# Patient Record
Sex: Female | Born: 1940 | Race: White | Hispanic: No | Marital: Married | Smoking: Never smoker
Health system: Southern US, Community
[De-identification: ages and names within clinical notes are randomized; demographics above are authoritative.]

## PROBLEM LIST (undated history)

## (undated) DIAGNOSIS — R404 Transient alteration of awareness: Secondary | ICD-10-CM

## (undated) DIAGNOSIS — I8291 Chronic embolism and thrombosis of unspecified vein: Secondary | ICD-10-CM

## (undated) DIAGNOSIS — M8580 Other specified disorders of bone density and structure, unspecified site: Secondary | ICD-10-CM

## (undated) DIAGNOSIS — E785 Hyperlipidemia, unspecified: Secondary | ICD-10-CM

## (undated) DIAGNOSIS — M40209 Unspecified kyphosis, site unspecified: Secondary | ICD-10-CM

## (undated) HISTORY — DX: Transient alteration of awareness: R40.4

## (undated) HISTORY — PX: OTHER SURGICAL HISTORY: SHX169

## (undated) HISTORY — DX: Hyperlipidemia, unspecified: E78.5

## (undated) HISTORY — PX: BLADDER SURGERY: SHX569

## (undated) HISTORY — DX: Unspecified kyphosis, site unspecified: M40.209

## (undated) HISTORY — DX: Chronic embolism and thrombosis of unspecified vein: I82.91

## (undated) HISTORY — DX: Other specified disorders of bone density and structure, unspecified site: M85.80

---

## 2015-07-02 DIAGNOSIS — E782 Mixed hyperlipidemia: Secondary | ICD-10-CM | POA: Diagnosis not present

## 2015-07-02 DIAGNOSIS — M40204 Unspecified kyphosis, thoracic region: Secondary | ICD-10-CM | POA: Diagnosis not present

## 2015-07-02 DIAGNOSIS — M8580 Other specified disorders of bone density and structure, unspecified site: Secondary | ICD-10-CM | POA: Diagnosis not present

## 2015-07-06 DIAGNOSIS — E042 Nontoxic multinodular goiter: Secondary | ICD-10-CM | POA: Diagnosis not present

## 2015-07-06 DIAGNOSIS — R221 Localized swelling, mass and lump, neck: Secondary | ICD-10-CM | POA: Diagnosis not present

## 2015-07-08 DIAGNOSIS — Z1231 Encounter for screening mammogram for malignant neoplasm of breast: Secondary | ICD-10-CM | POA: Diagnosis not present

## 2015-08-18 ENCOUNTER — Encounter: Payer: Self-pay | Admitting: Neurology

## 2015-08-18 ENCOUNTER — Ambulatory Visit: Payer: Medicare PPO | Admitting: Neurology

## 2015-08-18 ENCOUNTER — Ambulatory Visit (INDEPENDENT_AMBULATORY_CARE_PROVIDER_SITE_OTHER): Payer: Medicare PPO | Admitting: Neurology

## 2015-08-18 VITALS — BP 154/74 | HR 70 | Ht <= 58 in | Wt 147.2 lb

## 2015-08-18 DIAGNOSIS — E785 Hyperlipidemia, unspecified: Secondary | ICD-10-CM | POA: Insufficient documentation

## 2015-08-18 DIAGNOSIS — R51 Headache: Secondary | ICD-10-CM

## 2015-08-18 DIAGNOSIS — R519 Headache, unspecified: Secondary | ICD-10-CM

## 2015-08-18 DIAGNOSIS — R220 Localized swelling, mass and lump, head: Secondary | ICD-10-CM

## 2015-08-18 DIAGNOSIS — I676 Nonpyogenic thrombosis of intracranial venous system: Secondary | ICD-10-CM

## 2015-08-18 DIAGNOSIS — G08 Intracranial and intraspinal phlebitis and thrombophlebitis: Secondary | ICD-10-CM

## 2015-08-18 NOTE — Progress Notes (Signed)
[facility]    Provider:  Dr Lucia GaskinsAhern Referring Provider: Lovey Roberts, Selena S, PA Primary Care Physician:  No primary care provider on file.  CC:  Chronic venous thrombosis  HPI:  Selena Roberts is a 74 y.o. female here as a referral from Dr. Leavy CellaBoyd for chronic venous thrombosis. She was having right facial swelling. Started 6 months ago, she thought it was sinuses. She was having lots of drainage in the sinuses. She was having pain in the face, has resolved. She was having bone pain on the right cheek. They sent patient for imaging. 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 No headaches. No altered consciousness, no seizures, no focal weakness, no vision changes or blurry vision or double vision. No hx of cancer. 25 years ago fell on her head on the right side on a brick. [facility] stumbling on a curve 10 years ago. No recent falls. No recent trauma to the head. Never had blood clot in lungs or legs. She has had 2 miscariiages. No hearing changes or pulsatile tinnitus. She recently started taking aspirin daily.  Reviewed notes, labs and imaging from outside physicians, which showed:  [facility] describe a patient with back pain, chronic and stable. Hyperlipidemia. Nasal congestion, nasal drainage, facial pain and facial swelling and sinus congestion. Exam was significant was some minimal facial swelling along the mandible with intermittent discomfort which is why a CT of the neck with contrast was ordered.  CMP from 07/02/2015 is unremarkable with a creatinine of 0.85 and an alkaline phosphatase of 122 otherwise normal.  CT of the neck with contrast media showed mild atherosclerosis involving the carotid bulbs bilaterally without evidence of stenosis. Thrombosis of the left transverse sinus and sigmoid sinus intracranially with thrombus extending into the jugular bulb. The internal jugular vein is why the patent below the skull base.  The sigmoid and transverse sinuses are partially recanalized, and small collateral veins are present in the left posterior fossa. No focal parenchymal abnormalities on limited intracranial views involving the visualized brain. No evidence of mass involving the right side of the face or neck. No pathologic lymphadenopathy. No acute abnormalities. 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 The internal jugular vein is patent below the skull base.  Review of Systems: Patient complains of symptoms per HPI as well as the following symptoms: No chest pain, no shortness of breath, no fevers, no chills. Pertinent negatives per HPI. All others negative.   Social History   Social History  . Marital Status: Married    Spouse Name: Selena FearingJames   . Number of Children: 6  . Years of Education: N/A   Occupational History  . Retired    Social History Main Topics  . Smoking status: Never Smoker   . Smokeless tobacco: Not on file  . Alcohol Use: No  . Drug Use: No  . Sexual Activity: Not on file   Other Topics Concern  . Not on file   Social History Narrative   Lives at home with husband   Caffeine use: 5 drinks tea per week    Family History  Problem Relation Age of Onset  . Stroke Neg Hx   . Migraines Neg Hx     Past Medical History  Diagnosis Date  . Hyperlipidemia   . Osteopenia   . Kyphosis     Past Surgical History  Procedure Laterality Date  . No surgical history      Current Outpatient Prescriptions  Medication Sig Dispense Refill  . Cholecalciferol (VITAMIN D3) 2000 UNITS TABS Take 1 tablet by mouth daily.    . metFORMIN (GLUCOPHAGE-XR) 500 MG 24 hr tablet Take 500 mg by mouth daily.  2   No current facility-administered medications for this visit.    Allergies as of 08/18/2015  . (Not on File)    Vitals: BP 154/74 mmHg  Pulse 70  Ht   (1.473 m)  Wt 147 lb 3.2 oz (66.769 kg)  BMI 30.77 kg/m2 Last Weight:  Wt Readings from Last 1 Encounters:  08/18/15 147 lb 3.2 oz (66.769 kg)   Last Height:   Ht Readings from Last 1 Encounters:  08/18/15  (1.473 m)   Physical exam: Exam: Gen: NAD, conversant, well nourised, obese, well groomed                     CV: RRR, no MRG. No Carotid Bruits. No peripheral edema, warm, nontender Eyes: Conjunctivae clear without exudates or hemorrhage  Neuro: Detailed Neurologic Exam  Speech:    Speech is normal; fluent and spontaneous with normal comprehension.  Cognition:    The patient is oriented to person, place, and time;     recent and remote memory intact;     language fluent;     normal attention, concentration,     fund of knowledge Cranial Nerves:    The pupils are equal, round, and reactive to light. Attempted visualization of fundi but could not due to small pupils. Visual fields are full to finger confrontation. Extraocular movements are intact. Trigeminal sensation is intact and the muscles of mastication are normal. The face is symmetric. The palate elevates in the midline. Hearing intact. Voice is normal. Shoulder shrug is normal. The tongue has normal motion without fasciculations.   Coordination:    No dysmetria.   Gait:    Not ataxic  Motor Observation:    No asymmetry, no atrophy, and no involuntary movements noted. Tone:    Normal muscle tone.    Posture:    Posture is normal. normal erect    Strength:    Strength is V/V in the upper and lower limbs.      Sensation: intact to LT     Reflex Exam:  DTR's:    Deep tendon reflexes in the upper and lower extremities are normal bilaterally.   Toes:    The toes are downgoing bilaterally.   Clonus:    Clonus is absent.       Assessment/Plan:  74 year old female with right-sided facial pain. 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 Patient denies any history of malignancy or other disorders that would predispose her to thrombosis. However no underlying etiology or risk factor is not found in a percentage of adult patients with venous thrombosis. Will order serum workup for some prothrombotic conditions which increase risk for developing a CVT. Other precipitants for cerebral venous thrombosis include head trauma and patient has had several falls in the past. Will need more sensitive imaging and we will order an MRI of the brain and MRV of the head. Patient can follow up after imaging and labs completed. Continue daily aspirin.  CC: Claudie Fisherman, MD  [facility] [phone]  [address] [place], [place] [address]  Phone [phone] Fax [phone]

## 2015-08-18 NOTE — Patient Instructions (Signed)
Remember to drink plenty of fluid, eat healthy meals and do not skip any meals. Try to eat protein with a every meal and eat a healthy snack such as fruit or nuts in between meals. Try to keep a regular sleep-wake schedule and try to exercise daily, particularly in the form of walking, 20-30 minutes a day, if you can.   As far as your medications are concerned, I would like to suggest: daily aspirin  As far as diagnostic testing: MRI/MRV of the brain, labs  I would like to see you back in 3 months, sooner if we need to. Please call us with any interim questions, concerns, problems, updates or refill requests.   Please also call us for any test results so we can go over those with you on the phone.  My clinical assistant and will answer any of your questions and relay your messages to me and also relay most of my messages to you.   Our phone number is [phone]. We also have an after hours call service for urgent matters and there is a physician on-call for urgent questions. For any emergencies you know to call 911 or go to the nearest emergency room

## 2015-08-24 ENCOUNTER — Telehealth: Payer: Self-pay | Admitting: *Deleted

## 2015-08-24 LAB — FACTOR V LEIDEN

## 2015-08-24 LAB — COMPREHENSIVE METABOLIC PANEL
ALT: 14 IU/L (ref 0–32)
AST: 16 IU/L (ref 0–40)
Albumin/Globulin Ratio: 1.4 (ref 1.1–2.5)
Albumin: 4.1 g/dL (ref 3.5–4.8)
Alkaline Phosphatase: 115 IU/L (ref 39–117)
BUN/Creatinine Ratio: 14 (ref 11–26)
BUN: 10 mg/dL (ref 8–27)
Bilirubin Total: 0.4 mg/dL (ref 0.0–1.2)
CO2: 26 mmol/L (ref 18–29)
Calcium: 9.7 mg/dL (ref 8.7–10.3)
Chloride: 100 mmol/L (ref 97–106)
Creatinine, Ser: 0.73 mg/dL (ref 0.57–1.00)
GFR calc Af Amer: 93 mL/min/{1.73_m2} (ref >59)
GFR calc non Af Amer: 81 mL/min/{1.73_m2} (ref >59)
Globulin, Total: 3 g/dL (ref 1.5–4.5)
Glucose: 115 mg/dL — ABNORMAL HIGH (ref 65–99)
Potassium: 4.9 mmol/L (ref 3.5–5.2)
Sodium: 141 mmol/L (ref 136–144)
Total Protein: 7.1 g/dL (ref 6.0–8.5)

## 2015-08-24 LAB — HOMOCYSTEINE: Homocysteine: 9.2 umol/L (ref 0.0–15.0)

## 2015-08-24 LAB — PROTHROMBIN GENE MUTATION

## 2015-08-24 LAB — SEDIMENTATION RATE: Sed Rate: 4 mm/hr (ref 0–40)

## 2015-08-24 LAB — PROTEIN C ACTIVITY: Protein C Activity: 129 % (ref 73–180)

## 2015-08-24 LAB — ANTITHROMBIN PANEL
AT III AG PPP IMM-ACNC: 87 % (ref 72–124)
AntiThromb III Func: 104 % (ref 75–135)

## 2015-08-24 LAB — C-REACTIVE PROTEIN: CRP: 3.2 mg/L (ref 0.0–4.9)

## 2015-08-24 LAB — PROTEIN S ACTIVITY: Protein S Activity: 113 % (ref 63–140)

## 2015-08-24 NOTE — Telephone Encounter (Signed)
-----   Message from Anson FretAntonia B Ahern, MD sent at 08/24/2015  8:00 AM EDT ----- Please let patient know her labs were all normal thanks

## 2015-08-24 NOTE — Telephone Encounter (Signed)
Called pt and spoke w/ her about normal lab work. She verbalized understanding.

## 2015-09-07 ENCOUNTER — Ambulatory Visit (HOSPITAL_COMMUNITY)
Admission: RE | Admit: 2015-09-07 | Discharge: 2015-09-07 | Disposition: A | Discharge status: Home / Self Care | Payer: Medicare PPO | Source: Ambulatory Visit | Attending: Neurology | Admitting: Neurology

## 2015-09-07 DIAGNOSIS — R51 Headache: Secondary | ICD-10-CM | POA: Diagnosis not present

## 2015-09-07 DIAGNOSIS — I676 Nonpyogenic thrombosis of intracranial venous system: Secondary | ICD-10-CM | POA: Diagnosis not present

## 2015-09-07 DIAGNOSIS — R519 Headache, unspecified: Secondary | ICD-10-CM

## 2015-09-07 DIAGNOSIS — G08 Intracranial and intraspinal phlebitis and thrombophlebitis: Secondary | ICD-10-CM

## 2015-09-07 DIAGNOSIS — R220 Localized swelling, mass and lump, head: Secondary | ICD-10-CM

## 2015-09-07 DIAGNOSIS — R938 Abnormal findings on diagnostic imaging of other specified body structures: Secondary | ICD-10-CM | POA: Diagnosis not present

## 2015-09-09 ENCOUNTER — Telehealth: Payer: Self-pay | Admitting: Neurology

## 2015-09-09 NOTE — Telephone Encounter (Signed)
Dr. Roda ShuttersXu - I am out of the office and you ae the work-in doctor. Would you please call this patient and discuss results of MRi/MRV please? Thank you  MRV HEAD:  Preserved flow signal in the superior sagittal sinus, torcula, straight sinus, vein of Galen, and internal cerebral veins. Preserved flow signal in the right transverse sinus, sigmoid sinus, and right IJ bulb.  Near complete absence of flow signal along the left transverse sinus. Better preserved flow signal at the left sigmoid sinus and IJ bulb.  IMPRESSION: 1. 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 The appearance today appears stable to that on neck CT 07/06/2015. At that time the left internal jugular vein in the neck and the central venous system remained patent. Vascular Ultrasound of the neck versus repeat neck CT with IV contrast could further evaluate the neck as necessary.  2. No acute intracranial abnormality. Largely unremarkable noncontrast MRI appearance of brain parenchyma, with mild nonspecific cerebral white matter signal changes.  MRI HEAD:  Continued abnormal appearance of the left transverse and sigmoid sinuses to the level of the left IJ bulb (series 11). Subtotal filling defect within these dural sinuses was present in September. At that time the left IJ below the bulb remained patent to the chest. Other Major intracranial vascular flow voids are within normal limits.  No restricted diffusion to suggest acute infarction. No midline shift, mass effect, evidence of mass lesion, ventriculomegaly, extra-axial collection or acute intracranial hemorrhage. Cervicomedullary junction and pituitary are within normal limits.  Asymmetric volume loss along the left sylvian fissure, but without associated encephalomalacia. Mild for age supratentorial periventricular white matter nonspecific T2 and FLAIR hyperintensity. No chronic cerebral blood  products. Deep gray matter nuclei, brainstem, and cerebellum are within normal limits for age. Negative visualized cervical spine.  Visible internal auditory structures appear normal. Mastoids are clear. Negative paranasal sinuses. Negative orbit and scalp soft tissues. Visualized bone marrow signal is within normal limits

## 2015-09-09 NOTE — Telephone Encounter (Signed)
Discussed with pt over the phone. Relayed MRI and MRV result. Let her know that the left transverse sinus and sigmoid sinus as well as left IJ are similar with her CTA neck in 06/2012. It seems either chronic or developmental since there are collaterals building up already. She has no symptom and we recommend to continue ASA daily. She expressed understanding and appreciation. She has appointment with Dr. Lucia GaskinsAhern in 10/2015.   Marvel PlanJindong Merry Pond, MD PhD Stroke Neurology 09/09/2015 5:38 PM  Hi, Sheralyn Boatmanoni:  Actually CTA/CTV is much better to look for sinus thrombosis. In the future, if this pt needs more imaging for the venus thrombosis, we can order CTA or CTV head and neck at that time. Thanks.  Saraphina Lauderbaugh

## 2015-11-22 ENCOUNTER — Ambulatory Visit (INDEPENDENT_AMBULATORY_CARE_PROVIDER_SITE_OTHER): Payer: Medicare PPO | Admitting: Neurology

## 2015-11-22 ENCOUNTER — Encounter: Payer: Self-pay | Admitting: Neurology

## 2015-11-22 VITALS — BP 162/69 | HR 78 | Ht <= 58 in | Wt 144.8 lb

## 2015-11-22 DIAGNOSIS — G08 Intracranial and intraspinal phlebitis and thrombophlebitis: Secondary | ICD-10-CM | POA: Diagnosis not present

## 2015-11-22 NOTE — Patient Instructions (Signed)
Remember to drink plenty of fluid, eat healthy meals and do not skip any meals. Try to eat protein with a every meal and eat a healthy snack such as fruit or nuts in between meals. Try to keep a regular sleep-wake schedule and try to exercise daily, particularly in the form of walking, 20-30 minutes a day, if you can.   As far as your medications are concerned, I would like to suggest: continue Aspirin  I would like to see you back a needed, sooner if we need to. Please call us with any interim questions, concerns, problems, updates or refill requests.   Our phone number is [phone]. We also have an after hours call service for urgent matters and there is a physician on-call for urgent questions. For any emergencies you know to call 911 or go to the nearest emergency room

## 2015-11-22 NOTE — Progress Notes (Signed)
[facility]    Provider:  Dr Lucia Gaskins Referring Provider: No ref. provider found Primary Care Physician:  No primary care provider on file.  CC: Chronic venous thrombosis  Interval update 11/22/2015:Patient and husband return to discuss imaging. Shows chronic left venous thrombosis. There appear to be collaterals already formed so she may have had this for an unknow amout of time, possibly years. Reviewed images and showed patient and husband. Answered question. No intervention required. Continue with daily aspirin.   IMPRESSION: 1. 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 The appearance today appears stable to that on neck CT 07/06/2015. At that time the left internal jugular vein in the neck and the central venous system remained patent. Vascular Ultrasound of the neck versus repeat neck CT with IV contrast could further evaluate the neck as necessary. 2. No acute intracranial abnormality. Largely unremarkable noncontrast MRI appearance of brain parenchyma, with mild nonspecific cerebral white matter signal changes.  HPI: Selena Roberts is a 75 y.o. female here as a referral from Dr. Leavy Cella for chronic venous thrombosis. She was having right facial swelling. Started 6 months ago, she thought it was sinuses. She was having lots of drainage in the sinuses. She was having pain in the face, has resolved. She was having bone pain on the right cheek. They sent patient for imaging. 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 No headaches. No altered consciousness, no seizures, no focal weakness, no vision changes or blurry vision or double vision. No hx of cancer. 25 years ago fell on her head on the right side on a brick. She fell at [facility] after stumbling on a curve 10 years ago. No recent falls. No recent trauma to the head. Never had blood clot in lungs or legs. She has had 2 miscariiages. No hearing  changes or pulsatile tinnitus. She recently started taking aspirin daily.  Reviewed notes, labs and imaging from outside physicians, which showed:  [facility] describe a patient with back pain, chronic and stable. Hyperlipidemia. Nasal congestion, nasal drainage, facial pain and facial swelling and sinus congestion. Exam was significant was some minimal facial swelling along the mandible with intermittent discomfort which is why a CT of the neck with contrast was ordered.  CMP from 07/02/2015 is unremarkable with a creatinine of 0.85 and an alkaline phosphatase of 122 otherwise normal.  CT of the neck with contrast media showed mild atherosclerosis involving the carotid bulbs bilaterally without evidence of stenosis. Thrombosis of the left transverse sinus and sigmoid sinus intracranially with thrombus extending into the jugular bulb. The internal jugular vein is why the patent below the skull base. The sigmoid and transverse sinuses are partially recanalized, and small collateral veins are present in the left posterior fossa. No focal parenchymal abnormalities on limited intracranial views involving the visualized brain. No evidence of mass involving the right side of the face or neck. No pathologic lymphadenopathy. No acute abnormalities. 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 The internal jugular vein is patent below the skull base.  Review of Systems: Patient complains of symptoms per HPI as well as the following symptoms: no CO, no SOB, face fullness. Pertinent negatives per HPI. All others negative.   Social History   Social History  . Marital Status: Married    Spouse Name: Fayrene Fearing   . Number of Children: 6  .  Years of Education: N/A   Occupational History  . Retired    Social History Main Topics  . Smoking status: Never Smoker     . Smokeless tobacco: Not on file  . Alcohol Use: No  . Drug Use: No  . Sexual Activity: Not on file   Other Topics Concern  . Not on file   Social History Narrative   Lives at home with husband   Caffeine use: 5 drinks tea per week    Family History  Problem Relation Age of Onset  . Stroke Neg Hx   . Migraines Neg Hx     Past Medical History  Diagnosis Date  . Hyperlipidemia   . Osteopenia   . Kyphosis     Past Surgical History  Procedure Laterality Date  . No surgical history      Current Outpatient Prescriptions  Medication Sig Dispense Refill  . aspirin 81 MG tablet Take 81 mg by mouth daily.    . Cholecalciferol (VITAMIN D3) 2000 UNITS TABS Take 1 tablet by mouth daily.     No current facility-administered medications for this visit.    Allergies as of 11/22/2015  . (No Known Allergies)    Vitals: BP 162/69 mmHg  Pulse 78  Ht  (1.473 m)  Wt 144 lb 12.8 oz (65.681 kg)  BMI 30.27 kg/m2 Last Weight:  Wt Readings from Last 1 Encounters:  11/22/15 144 lb 12.8 oz (65.681 kg)   Last Height:   Ht Readings from Last 1 Encounters:  11/22/15  (1.473 m)    Speech:  Speech is normal; fluent and spontaneous with normal comprehension.  Cognition:  The patient is oriented to person, place, and time;   recent and remote memory intact;   language fluent;   normal attention, concentration,   fund of knowledge Cranial Nerves:  The pupils are equal, round, and reactive to light.  Visual fields are full to finger confrontation. Extraocular movements are intact. Trigeminal sensation is intact and the muscles of mastication are normal. The face is symmetric. The palate elevates in the midline. Hearing intact. Voice is normal. Shoulder shrug is normal. The tongue has normal motion without fasciculations.    Motor Observation:  No asymmetry, no atrophy, and no involuntary movements noted. Tone:  Normal muscle tone.    Posture:  Posture is normal. normal erect   Strength:  Strength is V/V in the upper and lower limbs.         Assessment/Plan: 75 year old female with right-sided facial pain. 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 Patient denies any history of malignancy or other disorders that would predispose her to thrombosis. However no underlying etiology or risk factor is not found in a percentage of adult patients with venous thrombosis.   Serum workup for some prothrombotic conditions which increase risk for developing a CVT were negative. Other precipitants for cerebral venous thrombosis include head trauma and patient has had several falls in the past. Continue daily aspirin   MRI and MRV showed the left transverse sinus and sigmoid sinus as well as left IJ thrombosis. It seems either chronic or developmental since there are collaterals building up already. No intervention exessary, we recommend to continue ASA daily.  CC: Claudie Fisherman, MD  [facility] [address] [place], [place] [address]  Phone [phone] Fax  [phone]  A total of 30 minutes was spent face-to-face with this patient. Over half this time was spent on counseling patient on the chronic venous thrombosis  diagnosis and different diagnostic and therapeutic options available.

## 2016-03-14 DIAGNOSIS — E782 Mixed hyperlipidemia: Secondary | ICD-10-CM | POA: Diagnosis not present

## 2016-03-14 DIAGNOSIS — M8580 Other specified disorders of bone density and structure, unspecified site: Secondary | ICD-10-CM | POA: Diagnosis not present

## 2016-03-14 DIAGNOSIS — M40204 Unspecified kyphosis, thoracic region: Secondary | ICD-10-CM | POA: Diagnosis not present

## 2016-04-14 DIAGNOSIS — M40204 Unspecified kyphosis, thoracic region: Secondary | ICD-10-CM | POA: Diagnosis not present

## 2016-04-14 DIAGNOSIS — E782 Mixed hyperlipidemia: Secondary | ICD-10-CM | POA: Diagnosis not present

## 2016-04-14 DIAGNOSIS — M8580 Other specified disorders of bone density and structure, unspecified site: Secondary | ICD-10-CM | POA: Diagnosis not present

## 2016-04-26 DIAGNOSIS — M810 Age-related osteoporosis without current pathological fracture: Secondary | ICD-10-CM | POA: Diagnosis not present

## 2016-04-26 DIAGNOSIS — Z780 Asymptomatic menopausal state: Secondary | ICD-10-CM | POA: Diagnosis not present

## 2016-04-26 DIAGNOSIS — Z7982 Long term (current) use of aspirin: Secondary | ICD-10-CM | POA: Diagnosis not present

## 2016-04-26 DIAGNOSIS — Z79899 Other long term (current) drug therapy: Secondary | ICD-10-CM | POA: Diagnosis not present

## 2016-07-18 DIAGNOSIS — E782 Mixed hyperlipidemia: Secondary | ICD-10-CM | POA: Diagnosis not present

## 2016-07-18 DIAGNOSIS — M40204 Unspecified kyphosis, thoracic region: Secondary | ICD-10-CM | POA: Diagnosis not present

## 2016-07-18 DIAGNOSIS — M8580 Other specified disorders of bone density and structure, unspecified site: Secondary | ICD-10-CM | POA: Diagnosis not present

## 2016-07-18 DIAGNOSIS — Z6830 Body mass index (BMI) 30.0-30.9, adult: Secondary | ICD-10-CM | POA: Diagnosis not present

## 2016-07-18 DIAGNOSIS — Z1231 Encounter for screening mammogram for malignant neoplasm of breast: Secondary | ICD-10-CM | POA: Diagnosis not present

## 2016-07-18 DIAGNOSIS — R5383 Other fatigue: Secondary | ICD-10-CM | POA: Diagnosis not present

## 2016-07-24 DIAGNOSIS — Z1231 Encounter for screening mammogram for malignant neoplasm of breast: Secondary | ICD-10-CM | POA: Diagnosis not present

## 2016-07-24 DIAGNOSIS — D519 Vitamin B12 deficiency anemia, unspecified: Secondary | ICD-10-CM | POA: Diagnosis not present

## 2016-07-25 IMAGING — MR MR HEAD W/O CM
7 of 12 series · 22 of 48 positions shown · non-contrast
Comparison: [facility]] neck CT with contrast
07/06/2015.

CLINICAL DATA: 73-year-old female with headache and facial swelling
for 6 months. No known injury. Initial encounter.

EXAM:
MRI HEAD WITHOUT CONTRAST
MRV HEAD WITHOUT CONTRAST
TECHNIQUE: Multiplanar, multiecho pulse sequences of the brain and surrounding
structures were obtained withoutintravenous contrast. Angiographic
images of the intracranial venous structures were obtained using MRV
technique without intravenous contrast.

[Series 3: T1 · sagittal · 5.0mm · 0.43mm/px · 3 of 21 slices shown (1 of 2)]
[im 1/21]
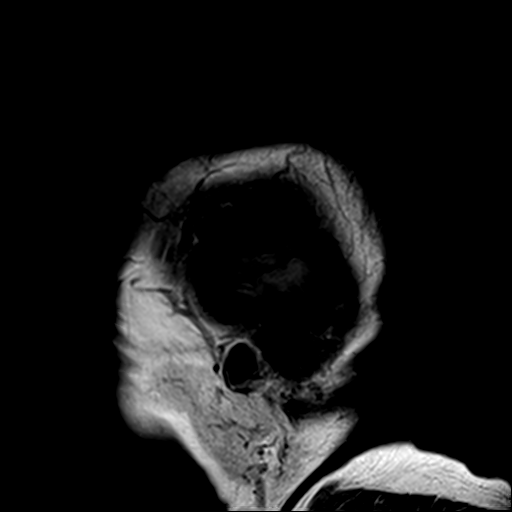
[im 11/21]
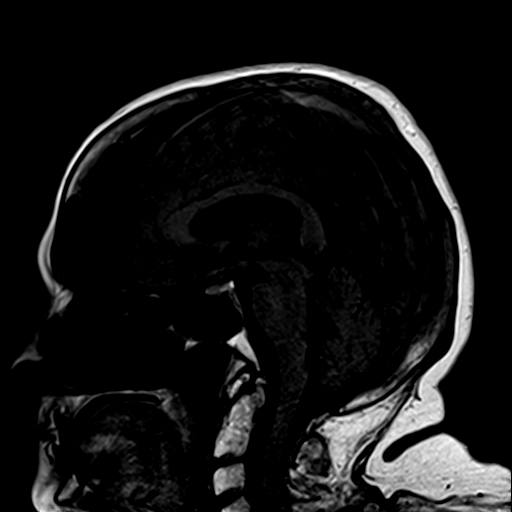
[im 21/21]
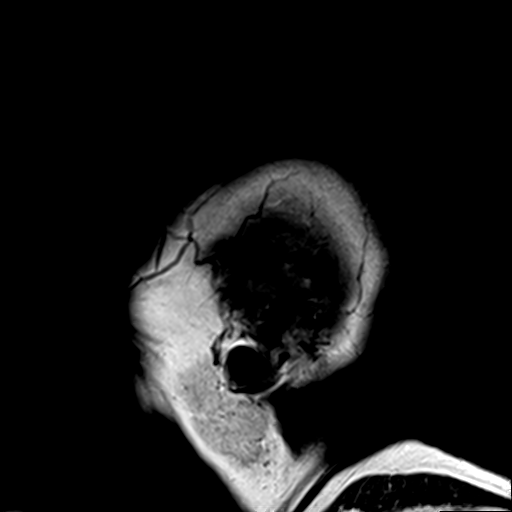

[Series 6: T2 · axial · 5.0mm · 0.51mm/px · z∈[-77,+60]mm · 3 of 23 slices shown (1 of 3)]
[im 1/23]
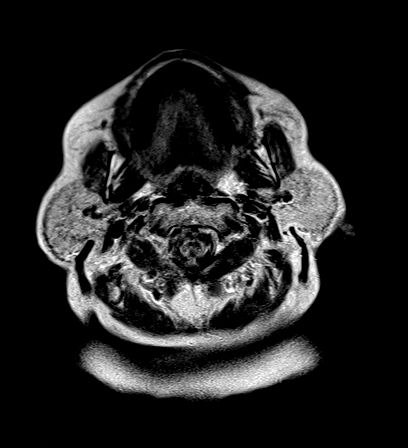
[im 12/23]
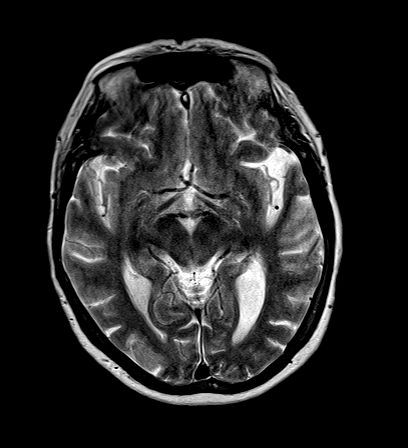
[im 23/23]
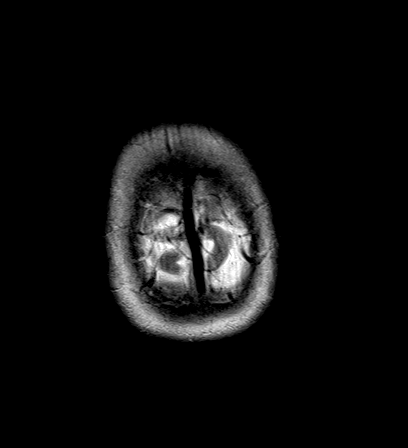

[Series 7: FLAIR · axial · 5.0mm · 0.38mm/px · z∈[-78,+59]mm · 3 of 23 slices shown (1 of 2)]
[im 1/23]
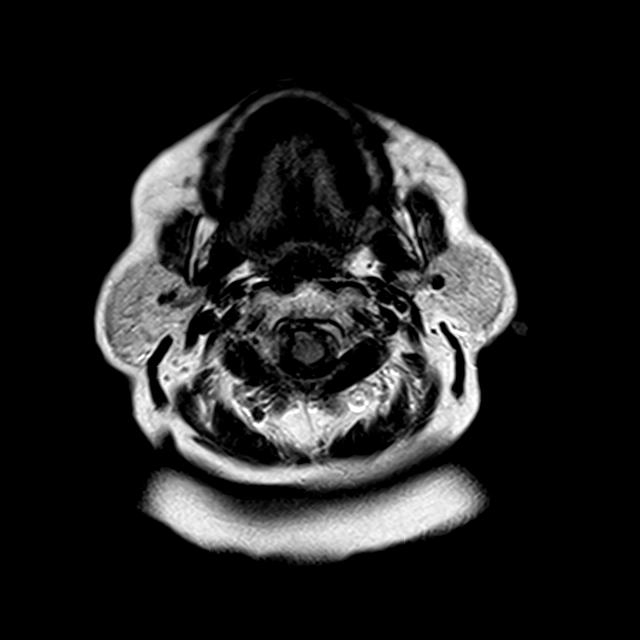
[im 12/23]
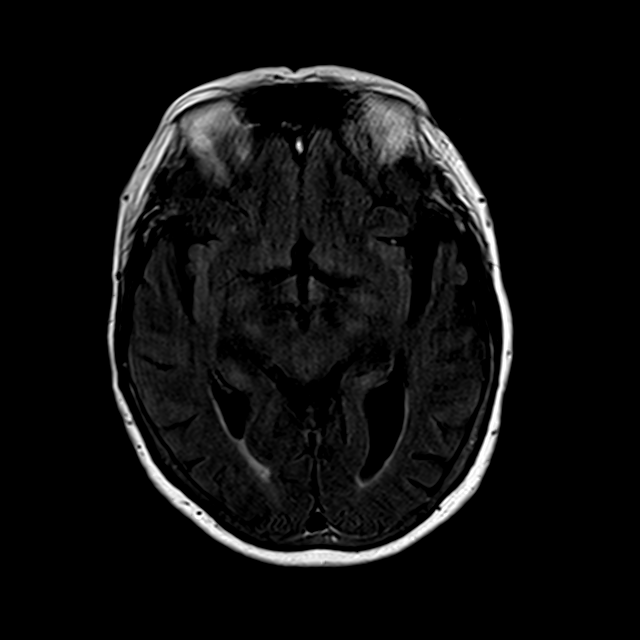
[im 23/23]
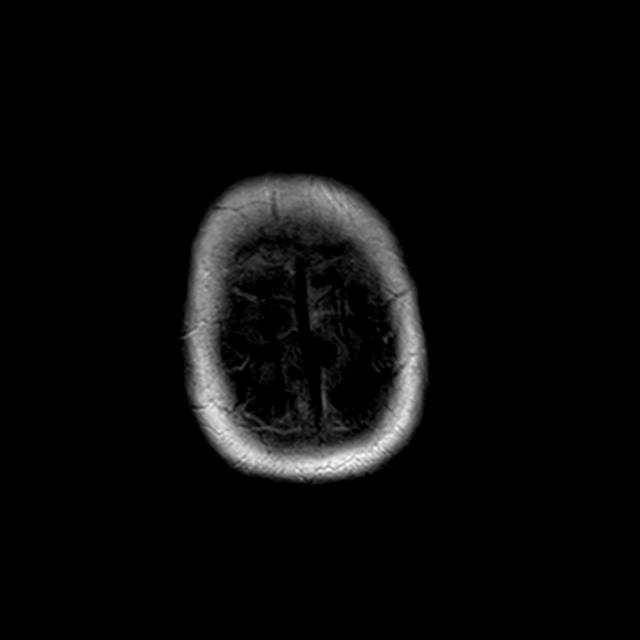

[Series 8: T1 · axial · 2.0mm · 0.45mm/px · z∈[-89,+30]mm · 6 of 95 slices shown (2 of 2)]
[im 1/95]
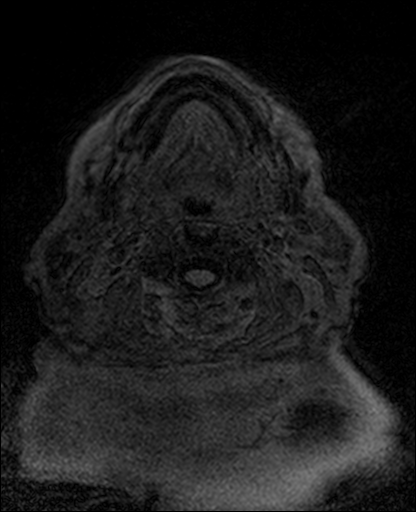
[im 11/95]
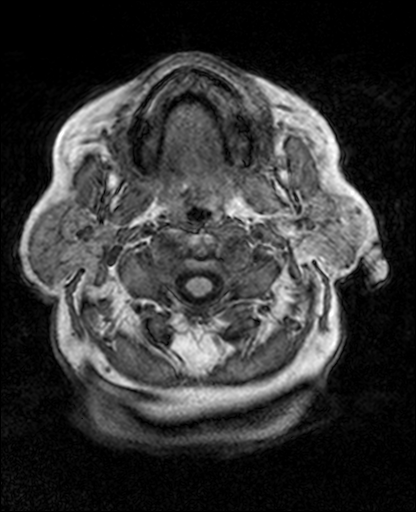
[im 32/95]
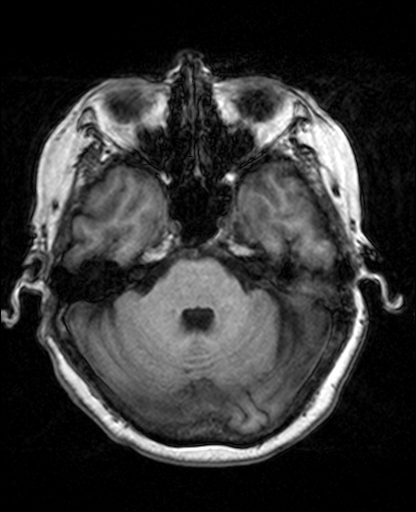
[im 42/95]
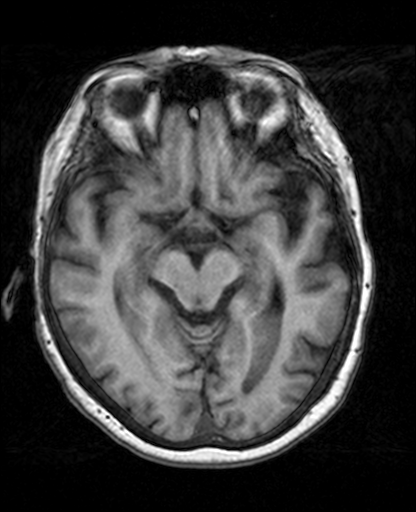
[im 53/95]
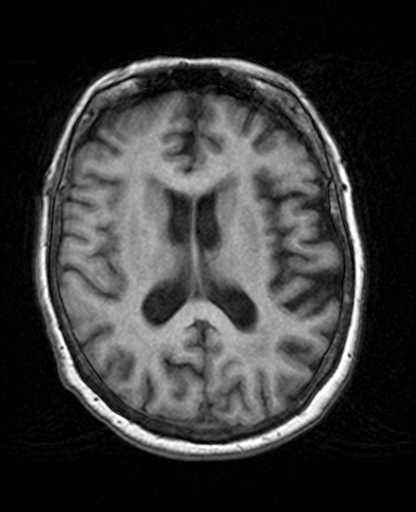
[im 63/95]
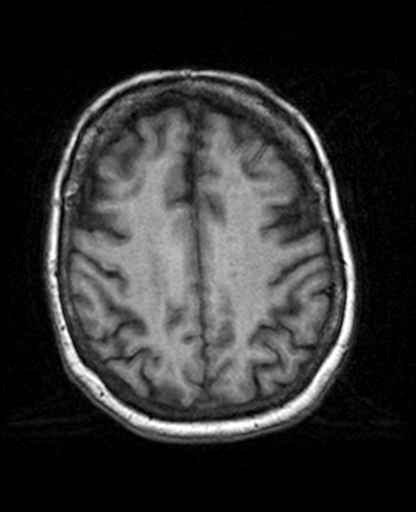

[Series 10: T2 · coronal · 5.0mm · 0.42mm/px · 3 of 24 slices shown (2 of 3)]
[im 1/24]
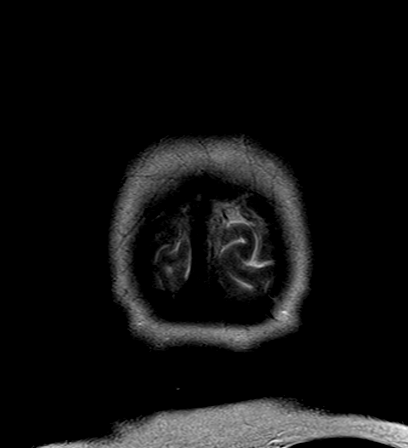
[im 12/24]
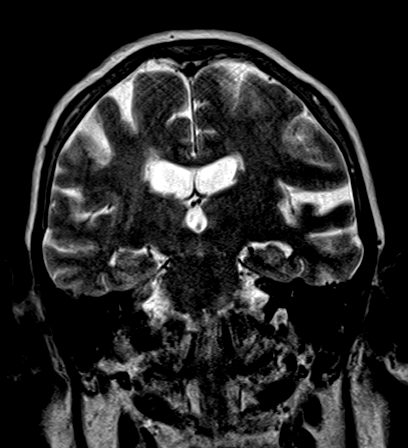
[im 24/24]
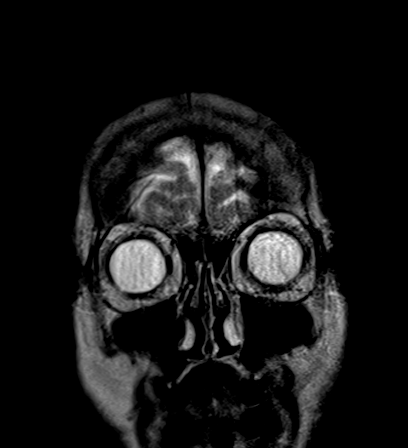

[Series 11: T2 · axial · 5.0mm · 0.75mm/px · z∈[-78,+59]mm · 2 of 23 slices shown (3 of 3)]
[im 1/23]
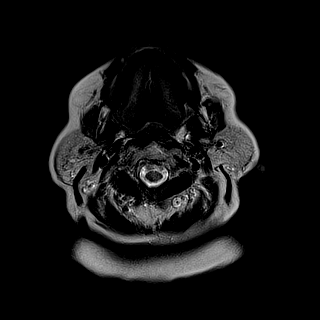
[im 23/23]
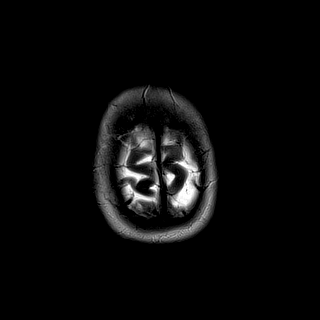

[Series 12: FLAIR · axial · 5.0mm · 0.94mm/px · z∈[-78,+59]mm · 2 of 23 slices shown (2 of 2)]
[im 1/23]
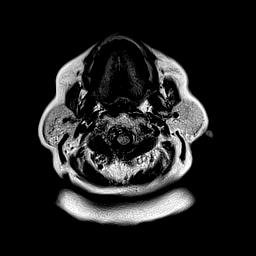
[im 23/23]
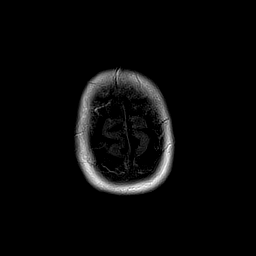

[22 of 48 positions shown; findings below may reference images not displayed]

FINDINGS: MRI HEAD:

Continued abnormal appearance of the left transverse and sigmoid
sinuses to the level of the left IJ bulb (series 11). Subtotal
filling defect within these dural sinuses was present in [REDACTED].
At that time the left IJ below the bulb remained patent to the
chest. Other Major intracranial vascular flow voids are within
normal limits.

No restricted diffusion to suggest acute infarction. No midline
shift, mass effect, evidence of mass lesion, ventriculomegaly,
extra-axial collection or acute intracranial hemorrhage.
Cervicomedullary junction and pituitary are within normal limits.

Asymmetric volume loss along the left sylvian fissure, but without
associated encephalomalacia. Mild for age supratentorial
periventricular white matter nonspecific T2 and FLAIR
hyperintensity. No chronic cerebral blood products. Deep gray matter
nuclei, brainstem, and cerebellum are within normal limits for age.
Negative visualized cervical spine.

Visible internal auditory structures appear normal. Mastoids are
clear. Negative paranasal sinuses. Negative orbit and scalp soft
tissues. Visualized bone marrow signal is within normal limits.

MRV HEAD:

Preserved flow signal in the superior sagittal sinus, torcula,
straight sinus, vein of Heinen, and internal cerebral veins.
Preserved flow signal in the right transverse sinus, sigmoid sinus,
and right IJ bulb.

Near complete absence of flow signal along the left transverse
sinus. Better preserved flow signal at the left sigmoid sinus and IJ
bulb.
IMPRESSION: 1. Subacute versus chronic left transverse sinus, sigmoid sinus, and
IJ bulb thrombosis with partial re-cannulization. The appearance
today appears stable to that on neck CT 07/06/2015. At that time the
left internal jugular vein in the neck and the central venous system
remained patent. Vascular Ultrasound of the neck versus repeat neck
CT with IV contrast could further evaluate the neck as necessary.
2. No acute intracranial abnormality. Largely unremarkable
noncontrast MRI appearance of brain parenchyma, with mild
nonspecific cerebral white matter signal changes.

## 2016-07-25 IMAGING — MR MR MRV HEAD W/O CM
2 series · 48 of 48 positions shown · non-contrast
Comparison: [facility]] neck CT with contrast
07/06/2015.

CLINICAL DATA: 73-year-old female with headache and facial swelling
for 6 months. No known injury. Initial encounter.

EXAM:
MRI HEAD WITHOUT CONTRAST
MRV HEAD WITHOUT CONTRAST
TECHNIQUE: Multiplanar, multiecho pulse sequences of the brain and surrounding
structures were obtained withoutintravenous contrast. Angiographic
images of the intracranial venous structures were obtained using MRV
technique without intravenous contrast.

[Series 1: tof_2d_veins · sagittal · 3.0mm · 0.98mm/px · 23 of 75 slices shown]
[im 1/75]
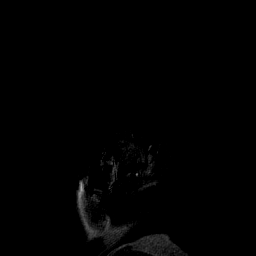
[im 4/75]
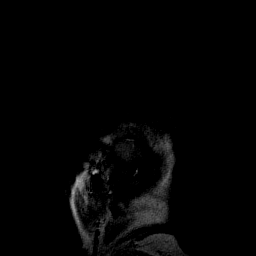
[im 7/75]
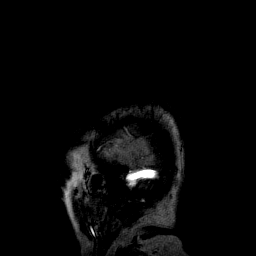
[im 11/75]
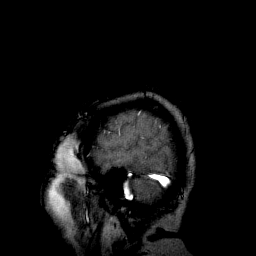
[im 14/75]
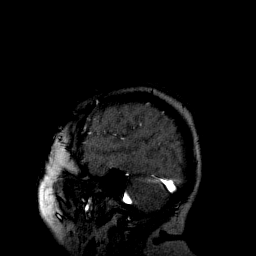
[im 17/75]
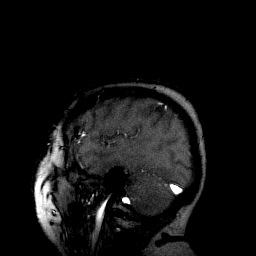
[im 21/75]
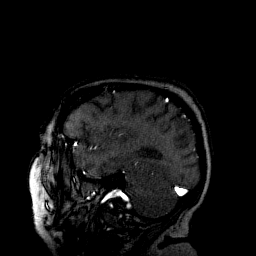
[im 24/75]
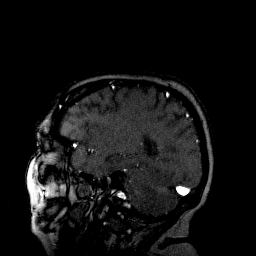
[im 27/75]
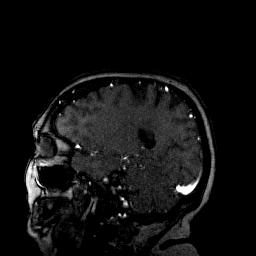
[im 31/75]
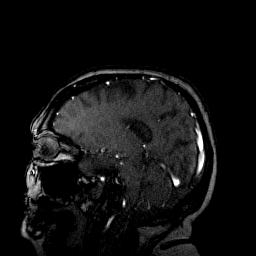
[im 34/75]
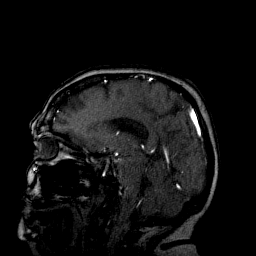
[im 38/75]
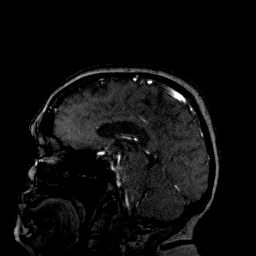
[im 41/75]
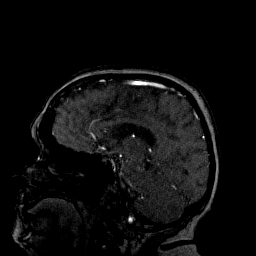
[im 44/75]
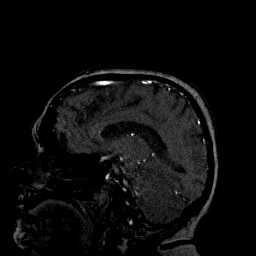
[im 48/75]
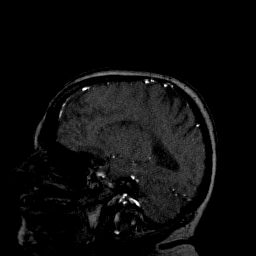
[im 51/75]
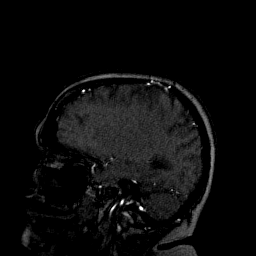
[im 54/75]
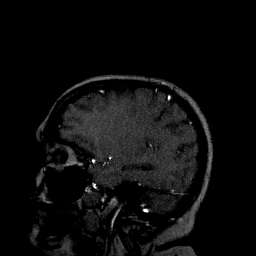
[im 58/75]
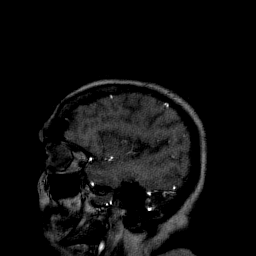
[im 61/75]
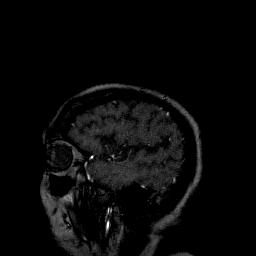
[im 64/75]
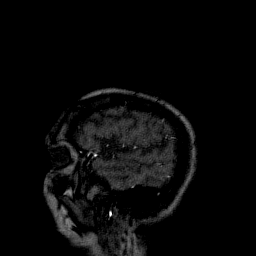
[im 68/75]
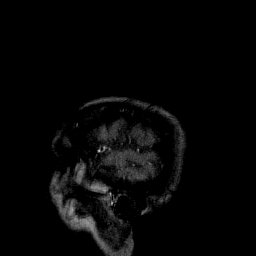
[im 71/75]
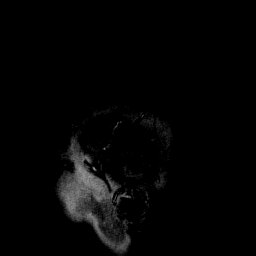
[im 75/75]
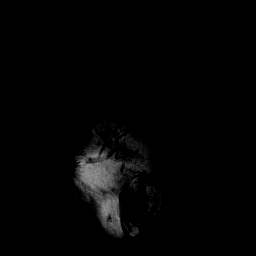

[Series 104: <mpr range> · coronal · 3.0mm · 0.80mm/px · 25 of 83 slices shown]
[im 1/83]
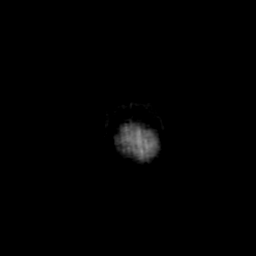
[im 4/83]
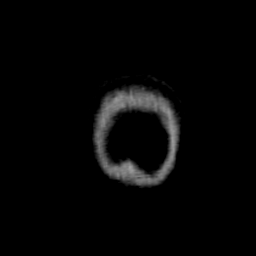
[im 7/83]
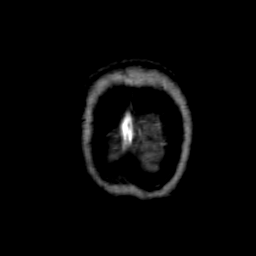
[im 11/83]
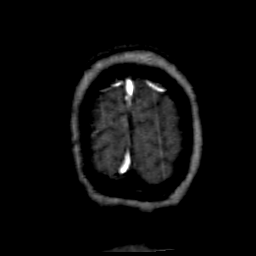
[im 14/83]
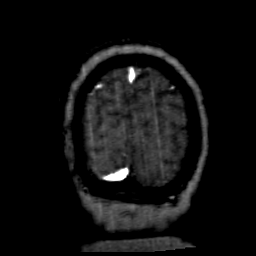
[im 18/83]
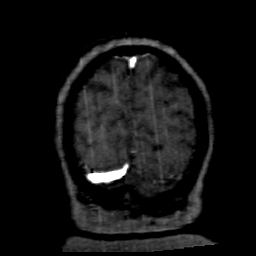
[im 21/83]
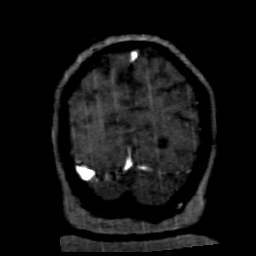
[im 24/83]
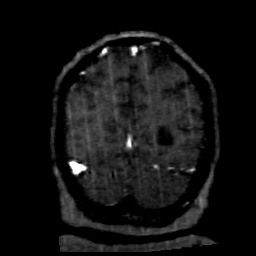
[im 28/83]
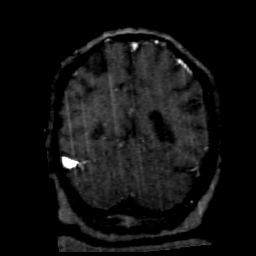
[im 31/83]
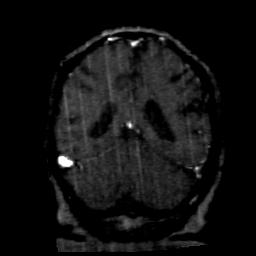
[im 35/83]
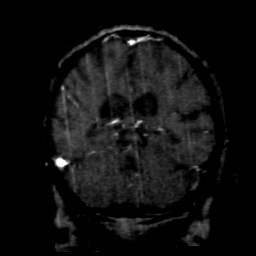
[im 38/83]
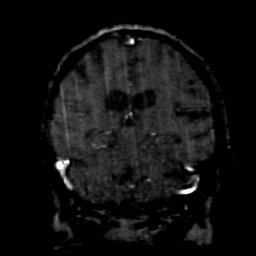
[im 42/83]
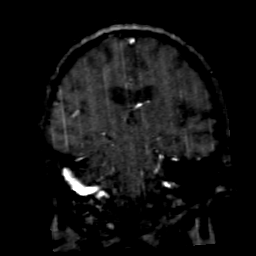
[im 45/83]
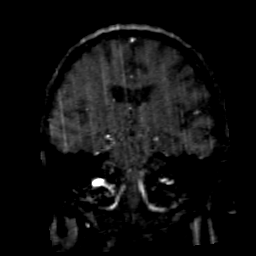
[im 48/83]
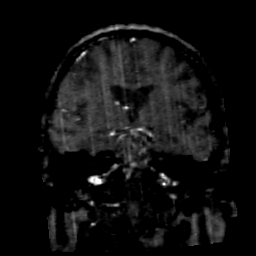
[im 52/83]
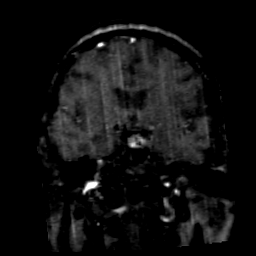
[im 55/83]
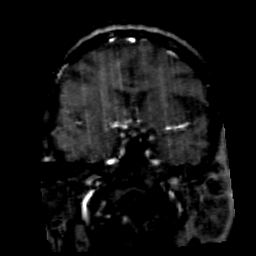
[im 59/83]
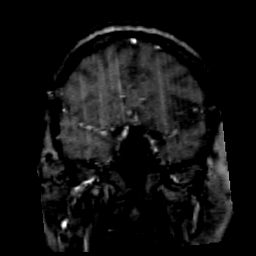
[im 62/83]
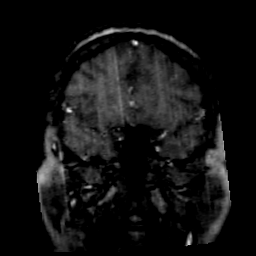
[im 65/83]
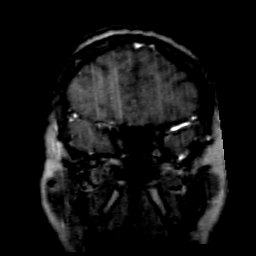
[im 69/83]
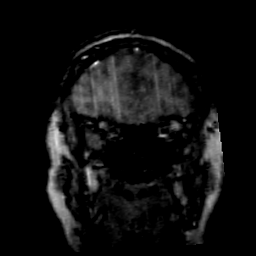
[im 72/83]
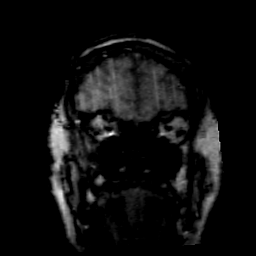
[im 76/83]
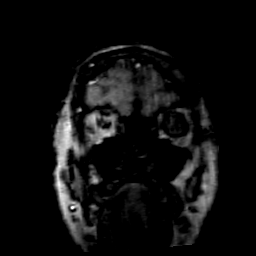
[im 79/83]
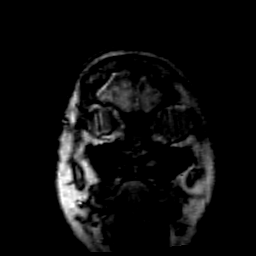
[im 83/83]
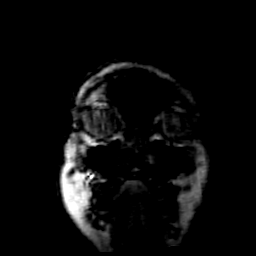

[48 of 48 positions shown; findings below may reference images not displayed]

FINDINGS: MRI HEAD:

Continued abnormal appearance of the left transverse and sigmoid
sinuses to the level of the left IJ bulb (series 11). Subtotal
filling defect within these dural sinuses was present in [REDACTED].
At that time the left IJ below the bulb remained patent to the
chest. Other Major intracranial vascular flow voids are within
normal limits.

No restricted diffusion to suggest acute infarction. No midline
shift, mass effect, evidence of mass lesion, ventriculomegaly,
extra-axial collection or acute intracranial hemorrhage.
Cervicomedullary junction and pituitary are within normal limits.

Asymmetric volume loss along the left sylvian fissure, but without
associated encephalomalacia. Mild for age supratentorial
periventricular white matter nonspecific T2 and FLAIR
hyperintensity. No chronic cerebral blood products. Deep gray matter
nuclei, brainstem, and cerebellum are within normal limits for age.
Negative visualized cervical spine.

Visible internal auditory structures appear normal. Mastoids are
clear. Negative paranasal sinuses. Negative orbit and scalp soft
tissues. Visualized bone marrow signal is within normal limits.

MRV HEAD:

Preserved flow signal in the superior sagittal sinus, torcula,
straight sinus, vein of Heinen, and internal cerebral veins.
Preserved flow signal in the right transverse sinus, sigmoid sinus,
and right IJ bulb.

Near complete absence of flow signal along the left transverse
sinus. Better preserved flow signal at the left sigmoid sinus and IJ
bulb.
IMPRESSION: 1. Subacute versus chronic left transverse sinus, sigmoid sinus, and
IJ bulb thrombosis with partial re-cannulization. The appearance
today appears stable to that on neck CT 07/06/2015. At that time the
left internal jugular vein in the neck and the central venous system
remained patent. Vascular Ultrasound of the neck versus repeat neck
CT with IV contrast could further evaluate the neck as necessary.
2. No acute intracranial abnormality. Largely unremarkable
noncontrast MRI appearance of brain parenchyma, with mild
nonspecific cerebral white matter signal changes.

## 2016-08-01 DIAGNOSIS — D519 Vitamin B12 deficiency anemia, unspecified: Secondary | ICD-10-CM | POA: Diagnosis not present

## 2016-08-07 DIAGNOSIS — D519 Vitamin B12 deficiency anemia, unspecified: Secondary | ICD-10-CM | POA: Diagnosis not present

## 2016-08-14 DIAGNOSIS — D519 Vitamin B12 deficiency anemia, unspecified: Secondary | ICD-10-CM | POA: Diagnosis not present

## 2016-09-13 DIAGNOSIS — D519 Vitamin B12 deficiency anemia, unspecified: Secondary | ICD-10-CM | POA: Diagnosis not present

## 2016-10-13 DIAGNOSIS — D519 Vitamin B12 deficiency anemia, unspecified: Secondary | ICD-10-CM | POA: Diagnosis not present

## 2016-11-14 DIAGNOSIS — R5383 Other fatigue: Secondary | ICD-10-CM | POA: Diagnosis not present

## 2016-11-14 DIAGNOSIS — M8580 Other specified disorders of bone density and structure, unspecified site: Secondary | ICD-10-CM | POA: Diagnosis not present

## 2016-11-14 DIAGNOSIS — Z6829 Body mass index (BMI) 29.0-29.9, adult: Secondary | ICD-10-CM | POA: Diagnosis not present

## 2016-11-14 DIAGNOSIS — R739 Hyperglycemia, unspecified: Secondary | ICD-10-CM | POA: Diagnosis not present

## 2016-11-14 DIAGNOSIS — M40204 Unspecified kyphosis, thoracic region: Secondary | ICD-10-CM | POA: Diagnosis not present

## 2016-11-14 DIAGNOSIS — E782 Mixed hyperlipidemia: Secondary | ICD-10-CM | POA: Diagnosis not present

## 2016-11-14 DIAGNOSIS — D519 Vitamin B12 deficiency anemia, unspecified: Secondary | ICD-10-CM | POA: Diagnosis not present

## 2016-12-06 DIAGNOSIS — D519 Vitamin B12 deficiency anemia, unspecified: Secondary | ICD-10-CM | POA: Diagnosis not present

## 2017-01-03 DIAGNOSIS — D519 Vitamin B12 deficiency anemia, unspecified: Secondary | ICD-10-CM | POA: Diagnosis not present

## 2017-02-07 DIAGNOSIS — D519 Vitamin B12 deficiency anemia, unspecified: Secondary | ICD-10-CM | POA: Diagnosis not present

## 2017-03-08 DIAGNOSIS — D519 Vitamin B12 deficiency anemia, unspecified: Secondary | ICD-10-CM | POA: Diagnosis not present

## 2017-03-08 DIAGNOSIS — Z6831 Body mass index (BMI) 31.0-31.9, adult: Secondary | ICD-10-CM | POA: Diagnosis not present

## 2017-03-08 DIAGNOSIS — Z0001 Encounter for general adult medical examination with abnormal findings: Secondary | ICD-10-CM | POA: Diagnosis not present

## 2017-03-08 DIAGNOSIS — E782 Mixed hyperlipidemia: Secondary | ICD-10-CM | POA: Diagnosis not present

## 2017-04-17 DIAGNOSIS — E782 Mixed hyperlipidemia: Secondary | ICD-10-CM | POA: Diagnosis not present

## 2017-04-17 DIAGNOSIS — M8580 Other specified disorders of bone density and structure, unspecified site: Secondary | ICD-10-CM | POA: Diagnosis not present

## 2017-04-17 DIAGNOSIS — M40204 Unspecified kyphosis, thoracic region: Secondary | ICD-10-CM | POA: Diagnosis not present

## 2017-04-17 DIAGNOSIS — D519 Vitamin B12 deficiency anemia, unspecified: Secondary | ICD-10-CM | POA: Diagnosis not present

## 2017-04-17 DIAGNOSIS — R5383 Other fatigue: Secondary | ICD-10-CM | POA: Diagnosis not present

## 2017-04-17 DIAGNOSIS — Z6829 Body mass index (BMI) 29.0-29.9, adult: Secondary | ICD-10-CM | POA: Diagnosis not present

## 2017-06-21 DIAGNOSIS — D519 Vitamin B12 deficiency anemia, unspecified: Secondary | ICD-10-CM | POA: Diagnosis not present

## 2017-07-20 DIAGNOSIS — D519 Vitamin B12 deficiency anemia, unspecified: Secondary | ICD-10-CM | POA: Diagnosis not present

## 2017-08-21 DIAGNOSIS — D519 Vitamin B12 deficiency anemia, unspecified: Secondary | ICD-10-CM | POA: Diagnosis not present

## 2017-09-20 DIAGNOSIS — D519 Vitamin B12 deficiency anemia, unspecified: Secondary | ICD-10-CM | POA: Diagnosis not present

## 2017-10-11 DIAGNOSIS — E782 Mixed hyperlipidemia: Secondary | ICD-10-CM | POA: Diagnosis not present

## 2017-10-11 DIAGNOSIS — M8580 Other specified disorders of bone density and structure, unspecified site: Secondary | ICD-10-CM | POA: Diagnosis not present

## 2017-10-11 DIAGNOSIS — Z6829 Body mass index (BMI) 29.0-29.9, adult: Secondary | ICD-10-CM | POA: Diagnosis not present

## 2017-10-11 DIAGNOSIS — D519 Vitamin B12 deficiency anemia, unspecified: Secondary | ICD-10-CM | POA: Diagnosis not present

## 2017-10-11 DIAGNOSIS — M40204 Unspecified kyphosis, thoracic region: Secondary | ICD-10-CM | POA: Diagnosis not present

## 2017-10-11 DIAGNOSIS — R5383 Other fatigue: Secondary | ICD-10-CM | POA: Diagnosis not present

## 2017-11-12 DIAGNOSIS — D519 Vitamin B12 deficiency anemia, unspecified: Secondary | ICD-10-CM | POA: Diagnosis not present

## 2017-12-13 DIAGNOSIS — D519 Vitamin B12 deficiency anemia, unspecified: Secondary | ICD-10-CM | POA: Diagnosis not present

## 2018-01-10 DIAGNOSIS — D519 Vitamin B12 deficiency anemia, unspecified: Secondary | ICD-10-CM | POA: Diagnosis not present

## 2018-02-13 DIAGNOSIS — D519 Vitamin B12 deficiency anemia, unspecified: Secondary | ICD-10-CM | POA: Diagnosis not present

## 2018-03-19 DIAGNOSIS — D519 Vitamin B12 deficiency anemia, unspecified: Secondary | ICD-10-CM | POA: Diagnosis not present

## 2018-04-01 DIAGNOSIS — I663 Occlusion and stenosis of cerebellar arteries: Secondary | ICD-10-CM | POA: Diagnosis not present

## 2018-04-01 DIAGNOSIS — Z6829 Body mass index (BMI) 29.0-29.9, adult: Secondary | ICD-10-CM | POA: Diagnosis not present

## 2018-04-01 DIAGNOSIS — E663 Overweight: Secondary | ICD-10-CM | POA: Diagnosis not present

## 2018-04-11 DIAGNOSIS — M545 Low back pain: Secondary | ICD-10-CM | POA: Diagnosis not present

## 2018-04-11 DIAGNOSIS — E782 Mixed hyperlipidemia: Secondary | ICD-10-CM | POA: Diagnosis not present

## 2018-04-11 DIAGNOSIS — D519 Vitamin B12 deficiency anemia, unspecified: Secondary | ICD-10-CM | POA: Diagnosis not present

## 2018-04-11 DIAGNOSIS — Z0001 Encounter for general adult medical examination with abnormal findings: Secondary | ICD-10-CM | POA: Diagnosis not present

## 2018-04-11 DIAGNOSIS — M40204 Unspecified kyphosis, thoracic region: Secondary | ICD-10-CM | POA: Diagnosis not present

## 2018-04-11 DIAGNOSIS — R5383 Other fatigue: Secondary | ICD-10-CM | POA: Diagnosis not present

## 2018-04-11 DIAGNOSIS — Z6841 Body Mass Index (BMI) 40.0 and over, adult: Secondary | ICD-10-CM | POA: Diagnosis not present

## 2018-04-11 DIAGNOSIS — M8580 Other specified disorders of bone density and structure, unspecified site: Secondary | ICD-10-CM | POA: Diagnosis not present

## 2018-04-12 DIAGNOSIS — M545 Low back pain: Secondary | ICD-10-CM | POA: Diagnosis not present

## 2018-04-12 DIAGNOSIS — M47816 Spondylosis without myelopathy or radiculopathy, lumbar region: Secondary | ICD-10-CM | POA: Diagnosis not present

## 2018-10-03 DIAGNOSIS — M40204 Unspecified kyphosis, thoracic region: Secondary | ICD-10-CM | POA: Diagnosis not present

## 2018-10-03 DIAGNOSIS — E782 Mixed hyperlipidemia: Secondary | ICD-10-CM | POA: Diagnosis not present

## 2018-10-03 DIAGNOSIS — Z6831 Body mass index (BMI) 31.0-31.9, adult: Secondary | ICD-10-CM | POA: Diagnosis not present

## 2018-10-03 DIAGNOSIS — D519 Vitamin B12 deficiency anemia, unspecified: Secondary | ICD-10-CM | POA: Diagnosis not present

## 2018-10-03 DIAGNOSIS — M8580 Other specified disorders of bone density and structure, unspecified site: Secondary | ICD-10-CM | POA: Diagnosis not present

## 2018-10-03 DIAGNOSIS — M545 Low back pain: Secondary | ICD-10-CM | POA: Diagnosis not present

## 2018-10-31 DIAGNOSIS — D519 Vitamin B12 deficiency anemia, unspecified: Secondary | ICD-10-CM | POA: Diagnosis not present

## 2018-11-28 DIAGNOSIS — D519 Vitamin B12 deficiency anemia, unspecified: Secondary | ICD-10-CM | POA: Diagnosis not present

## 2018-12-26 DIAGNOSIS — D519 Vitamin B12 deficiency anemia, unspecified: Secondary | ICD-10-CM | POA: Diagnosis not present

## 2019-01-27 DIAGNOSIS — D519 Vitamin B12 deficiency anemia, unspecified: Secondary | ICD-10-CM | POA: Diagnosis not present

## 2019-03-31 DIAGNOSIS — D519 Vitamin B12 deficiency anemia, unspecified: Secondary | ICD-10-CM | POA: Diagnosis not present

## 2019-03-31 DIAGNOSIS — Z0001 Encounter for general adult medical examination with abnormal findings: Secondary | ICD-10-CM | POA: Diagnosis not present

## 2019-03-31 DIAGNOSIS — R739 Hyperglycemia, unspecified: Secondary | ICD-10-CM | POA: Diagnosis not present

## 2019-03-31 DIAGNOSIS — Z6831 Body mass index (BMI) 31.0-31.9, adult: Secondary | ICD-10-CM | POA: Diagnosis not present

## 2019-03-31 DIAGNOSIS — M8580 Other specified disorders of bone density and structure, unspecified site: Secondary | ICD-10-CM | POA: Diagnosis not present

## 2019-03-31 DIAGNOSIS — M40204 Unspecified kyphosis, thoracic region: Secondary | ICD-10-CM | POA: Diagnosis not present

## 2019-03-31 DIAGNOSIS — E782 Mixed hyperlipidemia: Secondary | ICD-10-CM | POA: Diagnosis not present

## 2019-03-31 DIAGNOSIS — M545 Low back pain: Secondary | ICD-10-CM | POA: Diagnosis not present

## 2019-04-30 DIAGNOSIS — D519 Vitamin B12 deficiency anemia, unspecified: Secondary | ICD-10-CM | POA: Diagnosis not present

## 2019-05-28 DIAGNOSIS — D519 Vitamin B12 deficiency anemia, unspecified: Secondary | ICD-10-CM | POA: Diagnosis not present

## 2019-06-09 DIAGNOSIS — M810 Age-related osteoporosis without current pathological fracture: Secondary | ICD-10-CM | POA: Diagnosis not present

## 2019-07-01 DIAGNOSIS — D519 Vitamin B12 deficiency anemia, unspecified: Secondary | ICD-10-CM | POA: Diagnosis not present

## 2019-08-01 DIAGNOSIS — D519 Vitamin B12 deficiency anemia, unspecified: Secondary | ICD-10-CM | POA: Diagnosis not present

## 2019-09-01 DIAGNOSIS — D519 Vitamin B12 deficiency anemia, unspecified: Secondary | ICD-10-CM | POA: Diagnosis not present

## 2019-09-23 DIAGNOSIS — Z6828 Body mass index (BMI) 28.0-28.9, adult: Secondary | ICD-10-CM | POA: Diagnosis not present

## 2019-09-23 DIAGNOSIS — M810 Age-related osteoporosis without current pathological fracture: Secondary | ICD-10-CM | POA: Diagnosis not present

## 2019-09-23 DIAGNOSIS — J309 Allergic rhinitis, unspecified: Secondary | ICD-10-CM | POA: Diagnosis not present

## 2019-09-23 DIAGNOSIS — M8580 Other specified disorders of bone density and structure, unspecified site: Secondary | ICD-10-CM | POA: Diagnosis not present

## 2019-09-23 DIAGNOSIS — Z7982 Long term (current) use of aspirin: Secondary | ICD-10-CM | POA: Diagnosis not present

## 2019-10-15 DIAGNOSIS — M8580 Other specified disorders of bone density and structure, unspecified site: Secondary | ICD-10-CM | POA: Diagnosis not present

## 2019-10-15 DIAGNOSIS — R739 Hyperglycemia, unspecified: Secondary | ICD-10-CM | POA: Diagnosis not present

## 2019-10-15 DIAGNOSIS — Z1389 Encounter for screening for other disorder: Secondary | ICD-10-CM | POA: Diagnosis not present

## 2019-10-15 DIAGNOSIS — Z1331 Encounter for screening for depression: Secondary | ICD-10-CM | POA: Diagnosis not present

## 2019-10-15 DIAGNOSIS — D519 Vitamin B12 deficiency anemia, unspecified: Secondary | ICD-10-CM | POA: Diagnosis not present

## 2019-10-15 DIAGNOSIS — M40204 Unspecified kyphosis, thoracic region: Secondary | ICD-10-CM | POA: Diagnosis not present

## 2019-10-15 DIAGNOSIS — E782 Mixed hyperlipidemia: Secondary | ICD-10-CM | POA: Diagnosis not present

## 2019-10-15 DIAGNOSIS — Z6831 Body mass index (BMI) 31.0-31.9, adult: Secondary | ICD-10-CM | POA: Diagnosis not present

## 2020-01-12 DIAGNOSIS — E538 Deficiency of other specified B group vitamins: Secondary | ICD-10-CM | POA: Diagnosis not present

## 2020-02-04 DIAGNOSIS — H269 Unspecified cataract: Secondary | ICD-10-CM | POA: Diagnosis not present

## 2020-02-04 DIAGNOSIS — M545 Low back pain: Secondary | ICD-10-CM | POA: Diagnosis not present

## 2020-02-04 DIAGNOSIS — B351 Tinea unguium: Secondary | ICD-10-CM | POA: Diagnosis not present

## 2020-02-04 DIAGNOSIS — M25552 Pain in left hip: Secondary | ICD-10-CM | POA: Diagnosis not present

## 2020-02-04 DIAGNOSIS — I2510 Atherosclerotic heart disease of native coronary artery without angina pectoris: Secondary | ICD-10-CM | POA: Diagnosis not present

## 2020-02-04 DIAGNOSIS — J309 Allergic rhinitis, unspecified: Secondary | ICD-10-CM | POA: Diagnosis not present

## 2020-02-04 DIAGNOSIS — M810 Age-related osteoporosis without current pathological fracture: Secondary | ICD-10-CM | POA: Diagnosis not present

## 2020-02-04 DIAGNOSIS — M25551 Pain in right hip: Secondary | ICD-10-CM | POA: Diagnosis not present

## 2020-02-04 DIAGNOSIS — M40209 Unspecified kyphosis, site unspecified: Secondary | ICD-10-CM | POA: Diagnosis not present

## 2020-02-09 DIAGNOSIS — D519 Vitamin B12 deficiency anemia, unspecified: Secondary | ICD-10-CM | POA: Diagnosis not present

## 2020-03-18 DIAGNOSIS — E538 Deficiency of other specified B group vitamins: Secondary | ICD-10-CM | POA: Diagnosis not present

## 2020-03-29 DIAGNOSIS — M79672 Pain in left foot: Secondary | ICD-10-CM | POA: Diagnosis not present

## 2020-03-29 DIAGNOSIS — M722 Plantar fascial fibromatosis: Secondary | ICD-10-CM | POA: Diagnosis not present

## 2020-03-29 DIAGNOSIS — Z6831 Body mass index (BMI) 31.0-31.9, adult: Secondary | ICD-10-CM | POA: Diagnosis not present

## 2020-04-14 DIAGNOSIS — M40204 Unspecified kyphosis, thoracic region: Secondary | ICD-10-CM | POA: Diagnosis not present

## 2020-04-14 DIAGNOSIS — Z0001 Encounter for general adult medical examination with abnormal findings: Secondary | ICD-10-CM | POA: Diagnosis not present

## 2020-04-14 DIAGNOSIS — D519 Vitamin B12 deficiency anemia, unspecified: Secondary | ICD-10-CM | POA: Diagnosis not present

## 2020-04-14 DIAGNOSIS — Z6831 Body mass index (BMI) 31.0-31.9, adult: Secondary | ICD-10-CM | POA: Diagnosis not present

## 2020-04-14 DIAGNOSIS — R739 Hyperglycemia, unspecified: Secondary | ICD-10-CM | POA: Diagnosis not present

## 2020-04-14 DIAGNOSIS — E559 Vitamin D deficiency, unspecified: Secondary | ICD-10-CM | POA: Diagnosis not present

## 2020-04-14 DIAGNOSIS — M545 Low back pain: Secondary | ICD-10-CM | POA: Diagnosis not present

## 2020-04-14 DIAGNOSIS — E782 Mixed hyperlipidemia: Secondary | ICD-10-CM | POA: Diagnosis not present

## 2020-04-14 DIAGNOSIS — M8580 Other specified disorders of bone density and structure, unspecified site: Secondary | ICD-10-CM | POA: Diagnosis not present

## 2020-10-04 DIAGNOSIS — Z23 Encounter for immunization: Secondary | ICD-10-CM | POA: Diagnosis not present

## 2020-10-12 DIAGNOSIS — M40204 Unspecified kyphosis, thoracic region: Secondary | ICD-10-CM | POA: Diagnosis not present

## 2020-10-12 DIAGNOSIS — S39012A Strain of muscle, fascia and tendon of lower back, initial encounter: Secondary | ICD-10-CM | POA: Diagnosis not present

## 2020-10-12 DIAGNOSIS — R739 Hyperglycemia, unspecified: Secondary | ICD-10-CM | POA: Diagnosis not present

## 2020-10-12 DIAGNOSIS — Z6831 Body mass index (BMI) 31.0-31.9, adult: Secondary | ICD-10-CM | POA: Diagnosis not present

## 2020-10-12 DIAGNOSIS — M545 Low back pain, unspecified: Secondary | ICD-10-CM | POA: Diagnosis not present

## 2020-10-12 DIAGNOSIS — M8580 Other specified disorders of bone density and structure, unspecified site: Secondary | ICD-10-CM | POA: Diagnosis not present

## 2020-10-12 DIAGNOSIS — E782 Mixed hyperlipidemia: Secondary | ICD-10-CM | POA: Diagnosis not present

## 2020-10-12 DIAGNOSIS — D519 Vitamin B12 deficiency anemia, unspecified: Secondary | ICD-10-CM | POA: Diagnosis not present

## 2020-11-15 DIAGNOSIS — E538 Deficiency of other specified B group vitamins: Secondary | ICD-10-CM | POA: Diagnosis not present

## 2020-12-16 DIAGNOSIS — E538 Deficiency of other specified B group vitamins: Secondary | ICD-10-CM | POA: Diagnosis not present

## 2020-12-27 ENCOUNTER — Ambulatory Visit: Payer: Medicare PPO | Admitting: Neurology

## 2020-12-27 ENCOUNTER — Encounter: Payer: Self-pay | Admitting: Neurology

## 2020-12-27 VITALS — BP 152/67 | HR 74 | Ht <= 58 in | Wt 140.5 lb

## 2020-12-27 DIAGNOSIS — R404 Transient alteration of awareness: Secondary | ICD-10-CM | POA: Diagnosis not present
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
## 2021-01-13 DIAGNOSIS — E538 Deficiency of other specified B group vitamins: Secondary | ICD-10-CM | POA: Diagnosis not present

## 2021-04-11 DIAGNOSIS — Z6831 Body mass index (BMI) 31.0-31.9, adult: Secondary | ICD-10-CM | POA: Diagnosis not present

## 2021-04-11 DIAGNOSIS — E7849 Other hyperlipidemia: Secondary | ICD-10-CM | POA: Diagnosis not present

## 2021-04-11 DIAGNOSIS — D519 Vitamin B12 deficiency anemia, unspecified: Secondary | ICD-10-CM | POA: Diagnosis not present

## 2021-04-11 DIAGNOSIS — M40204 Unspecified kyphosis, thoracic region: Secondary | ICD-10-CM | POA: Diagnosis not present

## 2021-04-11 DIAGNOSIS — E782 Mixed hyperlipidemia: Secondary | ICD-10-CM | POA: Diagnosis not present

## 2021-04-11 DIAGNOSIS — S39012A Strain of muscle, fascia and tendon of lower back, initial encounter: Secondary | ICD-10-CM | POA: Diagnosis not present

## 2021-04-11 DIAGNOSIS — M545 Low back pain, unspecified: Secondary | ICD-10-CM | POA: Diagnosis not present

## 2021-04-11 DIAGNOSIS — M8580 Other specified disorders of bone density and structure, unspecified site: Secondary | ICD-10-CM | POA: Diagnosis not present

## 2021-04-11 DIAGNOSIS — R739 Hyperglycemia, unspecified: Secondary | ICD-10-CM | POA: Diagnosis not present

## 2021-05-05 DIAGNOSIS — N911 Secondary amenorrhea: Secondary | ICD-10-CM | POA: Diagnosis not present

## 2021-05-05 DIAGNOSIS — I517 Cardiomegaly: Secondary | ICD-10-CM | POA: Diagnosis not present

## 2021-07-06 DIAGNOSIS — J209 Acute bronchitis, unspecified: Secondary | ICD-10-CM | POA: Diagnosis not present

## 2021-07-06 DIAGNOSIS — R059 Cough, unspecified: Secondary | ICD-10-CM | POA: Diagnosis not present

## 2021-10-05 DIAGNOSIS — E559 Vitamin D deficiency, unspecified: Secondary | ICD-10-CM | POA: Diagnosis not present

## 2021-10-05 DIAGNOSIS — M8580 Other specified disorders of bone density and structure, unspecified site: Secondary | ICD-10-CM | POA: Diagnosis not present

## 2021-10-05 DIAGNOSIS — M40204 Unspecified kyphosis, thoracic region: Secondary | ICD-10-CM | POA: Diagnosis not present

## 2021-10-05 DIAGNOSIS — M545 Low back pain, unspecified: Secondary | ICD-10-CM | POA: Diagnosis not present

## 2021-10-05 DIAGNOSIS — D519 Vitamin B12 deficiency anemia, unspecified: Secondary | ICD-10-CM | POA: Diagnosis not present

## 2021-10-05 DIAGNOSIS — E7849 Other hyperlipidemia: Secondary | ICD-10-CM | POA: Diagnosis not present

## 2021-10-05 DIAGNOSIS — Z6829 Body mass index (BMI) 29.0-29.9, adult: Secondary | ICD-10-CM | POA: Diagnosis not present

## 2021-10-05 DIAGNOSIS — R739 Hyperglycemia, unspecified: Secondary | ICD-10-CM | POA: Diagnosis not present

## 2021-10-05 DIAGNOSIS — E782 Mixed hyperlipidemia: Secondary | ICD-10-CM | POA: Diagnosis not present

## 2022-01-31 DIAGNOSIS — Z6830 Body mass index (BMI) 30.0-30.9, adult: Secondary | ICD-10-CM | POA: Diagnosis not present

## 2022-01-31 DIAGNOSIS — E559 Vitamin D deficiency, unspecified: Secondary | ICD-10-CM | POA: Diagnosis not present

## 2022-01-31 DIAGNOSIS — M8580 Other specified disorders of bone density and structure, unspecified site: Secondary | ICD-10-CM | POA: Diagnosis not present

## 2022-01-31 DIAGNOSIS — M545 Low back pain, unspecified: Secondary | ICD-10-CM | POA: Diagnosis not present

## 2022-01-31 DIAGNOSIS — E782 Mixed hyperlipidemia: Secondary | ICD-10-CM | POA: Diagnosis not present

## 2022-01-31 DIAGNOSIS — E7849 Other hyperlipidemia: Secondary | ICD-10-CM | POA: Diagnosis not present

## 2022-01-31 DIAGNOSIS — R739 Hyperglycemia, unspecified: Secondary | ICD-10-CM | POA: Diagnosis not present

## 2022-01-31 DIAGNOSIS — D519 Vitamin B12 deficiency anemia, unspecified: Secondary | ICD-10-CM | POA: Diagnosis not present

## 2022-01-31 DIAGNOSIS — M40204 Unspecified kyphosis, thoracic region: Secondary | ICD-10-CM | POA: Diagnosis not present

## 2022-01-31 DIAGNOSIS — I10 Essential (primary) hypertension: Secondary | ICD-10-CM | POA: Diagnosis not present

## 2022-02-03 DIAGNOSIS — D519 Vitamin B12 deficiency anemia, unspecified: Secondary | ICD-10-CM | POA: Diagnosis not present

## 2022-02-03 DIAGNOSIS — Z6830 Body mass index (BMI) 30.0-30.9, adult: Secondary | ICD-10-CM | POA: Diagnosis not present

## 2022-02-03 DIAGNOSIS — F17210 Nicotine dependence, cigarettes, uncomplicated: Secondary | ICD-10-CM | POA: Diagnosis not present

## 2022-02-03 DIAGNOSIS — E7849 Other hyperlipidemia: Secondary | ICD-10-CM | POA: Diagnosis not present

## 2022-02-03 DIAGNOSIS — I10 Essential (primary) hypertension: Secondary | ICD-10-CM | POA: Diagnosis not present

## 2022-02-03 DIAGNOSIS — Z0001 Encounter for general adult medical examination with abnormal findings: Secondary | ICD-10-CM | POA: Diagnosis not present

## 2022-05-30 DIAGNOSIS — Z6829 Body mass index (BMI) 29.0-29.9, adult: Secondary | ICD-10-CM | POA: Diagnosis not present

## 2022-05-30 DIAGNOSIS — S39012A Strain of muscle, fascia and tendon of lower back, initial encounter: Secondary | ICD-10-CM | POA: Diagnosis not present

## 2022-05-30 DIAGNOSIS — R739 Hyperglycemia, unspecified: Secondary | ICD-10-CM | POA: Diagnosis not present

## 2022-05-30 DIAGNOSIS — M40204 Unspecified kyphosis, thoracic region: Secondary | ICD-10-CM | POA: Diagnosis not present

## 2022-05-30 DIAGNOSIS — E782 Mixed hyperlipidemia: Secondary | ICD-10-CM | POA: Diagnosis not present

## 2022-05-30 DIAGNOSIS — M8580 Other specified disorders of bone density and structure, unspecified site: Secondary | ICD-10-CM | POA: Diagnosis not present

## 2022-05-30 DIAGNOSIS — D519 Vitamin B12 deficiency anemia, unspecified: Secondary | ICD-10-CM | POA: Diagnosis not present

## 2022-09-28 DIAGNOSIS — M40204 Unspecified kyphosis, thoracic region: Secondary | ICD-10-CM | POA: Diagnosis not present

## 2022-09-28 DIAGNOSIS — R739 Hyperglycemia, unspecified: Secondary | ICD-10-CM | POA: Diagnosis not present

## 2022-09-28 DIAGNOSIS — D519 Vitamin B12 deficiency anemia, unspecified: Secondary | ICD-10-CM | POA: Diagnosis not present

## 2022-09-28 DIAGNOSIS — R0789 Other chest pain: Secondary | ICD-10-CM | POA: Diagnosis not present

## 2022-09-28 DIAGNOSIS — E7849 Other hyperlipidemia: Secondary | ICD-10-CM | POA: Diagnosis not present

## 2022-09-28 DIAGNOSIS — M546 Pain in thoracic spine: Secondary | ICD-10-CM | POA: Diagnosis not present

## 2022-09-28 DIAGNOSIS — M8580 Other specified disorders of bone density and structure, unspecified site: Secondary | ICD-10-CM | POA: Diagnosis not present

## 2022-09-28 DIAGNOSIS — M545 Low back pain, unspecified: Secondary | ICD-10-CM | POA: Diagnosis not present

## 2022-09-28 DIAGNOSIS — M40209 Unspecified kyphosis, site unspecified: Secondary | ICD-10-CM | POA: Diagnosis not present

## 2022-10-17 ENCOUNTER — Encounter: Payer: Self-pay | Admitting: Internal Medicine

## 2022-10-17 ENCOUNTER — Ambulatory Visit: Payer: Medicare PPO | Attending: Internal Medicine | Admitting: Internal Medicine

## 2022-10-17 VITALS — BP 148/66 | HR 79 | Ht <= 58 in | Wt 131.2 lb

## 2022-10-17 DIAGNOSIS — I10 Essential (primary) hypertension: Secondary | ICD-10-CM

## 2022-10-17 DIAGNOSIS — R079 Chest pain, unspecified: Secondary | ICD-10-CM

## 2022-10-17 NOTE — Patient Instructions (Signed)
Medication Instructions:  Your physician recommends that you continue on your current medications as directed. Please refer to the Current Medication list given to you today.   Labwork: None  Testing/Procedures: Your physician has requested that you have a lexiscan myoview. For further information please visit [email or web address]. Please follow instruction sheet, as given.   Follow-Up: Follow up with Dr. Mallipeddi as needed.   Any Other Special Instructions Will Be Listed Below (If Applicable).     If you need a refill on your cardiac medications before your next appointment, please call your pharmacy.  

## 2022-10-17 NOTE — Progress Notes (Signed)
Cardiology Office Note  Date: 10/17/2022   ID: DANESE BERNABEI, DOB 25-Dec-1940, MRN [id number]  PCP:  [facility]  Cardiologist:  None Electrophysiologist:  None   Reason for Office Visit: Chest pressure at the request of Luciana Axe, [place]   History of Present Illness: KRISTIONA SAVELL is a 81 y.o. female known to have HTN, prediabetes, HLD was referred to cardiology clinic for evaluation chest pressure requested Luciana Axe, PA-C.  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 She also has extreme fatigue for quite a while and she feels worn out after performing her daily activities at home. Denied dizziness, syncope, palpitations, LE swelling. Denies smoking cigarettes, alcohol use or illicit drug abuse. No family history of premature ASCVD.  Past Medical History:  Diagnosis Date   Altered awareness, transient    one event   Chronic venous thrombosis    Hyperlipidemia    Kyphosis    Osteopenia     Past Surgical History:  Procedure Laterality Date   BLADDER SURGERY      Current Outpatient Medications  Medication Sig Dispense Refill   aspirin 81 MG tablet Take 81 mg by mouth daily.     Cholecalciferol (VITAMIN D-3 PO) Take by mouth.     hydrochlorothiazide (HYDRODIURIL) 12.5 MG tablet Take 12.5 mg by mouth daily.     Multiple Vitamin (MULTIVITAMIN) tablet Take 1 tablet by mouth daily.     No current facility-administered medications for this visit.   Allergies:  Patient has no known allergies.   Social History: The patient  reports that she has never smoked. She has never used smokeless tobacco. She reports that she does not drink alcohol and does not use drugs.   Family History: The patient's family history includes Heart disease in her father; Pancreatic cancer in her mother.   ROS:  Please see the history of present illness. Otherwise, complete review of  systems is positive for none.  All other systems are reviewed and negative.   Physical Exam: VS:  BP (!) 148/66   Pulse 79   Ht 4\' 10"  (1.473 m)   Wt 131 lb 3.2 oz (59.5 kg)   SpO2 97%   BMI 27.42 kg/m , BMI Body mass index is 27.42 kg/m.  Wt Readings from Last 3 Encounters:  10/17/22 131 lb 3.2 oz (59.5 kg)  12/27/20 140 lb 8 oz (63.7 kg)  11/22/15 144 lb 12.8 oz (65.7 kg)    General: Patient appears comfortable at rest. HEENT: Conjunctiva and lids normal, oropharynx clear with moist mucosa. Neck: Supple, no elevated JVP or carotid bruits, no thyromegaly. Lungs: Clear to auscultation, nonlabored breathing at rest. Cardiac: Regular rate and rhythm, no S3 or significant systolic murmur, no pericardial rub. Abdomen: Soft, nontender, no hepatomegaly, bowel sounds present, no guarding or rebound. Extremities: No pitting edema, distal pulses 2+. Skin: Warm and dry. Musculoskeletal: No kyphosis. Neuropsychiatric: Alert and oriented x3, affect grossly appropriate.  ECG: Normal sinus rhythm and nonspecific T wave inversions in lateral leads  Recent Labwork: No results found for requested labs within last 365 days.  No results found for: "CHOL", "TRIG", "HDL", "CHOLHDL", "VLDL", "LDLCALC", "LDLDIRECT"  Other Studies Reviewed Today: I personally reviewed the heart care referral records from the PCP  Assessment and Plan: Patient is a 81 year old F known to have HTN, prediabetes, HLD was referred to cardiology clinic for evaluation chest pressure.  #  Chest pain of uncertain etiology -Patient has back pain associated with chest pressure and also extreme fatigue. These being anginal equivalents, she will benefit from pharmacological stress test.  # HTN, controlled -Continue HCTZ 12.5 mg once daily -Management per PCP  # HLD, not at goal -Management per PCP  I have spent a total of 44 minutes with patient reviewing chart, EKGs, labs and examining patient as well as establishing an  assessment and plan that was discussed with the patient.  > 50% of time was spent in direct patient care.      Medication Adjustments/Labs and Tests Ordered: Current medicines are reviewed at length with the patient today.  Concerns regarding medicines are outlined above.   Tests Ordered: Orders Placed This Encounter  Procedures   NM Myocar Multi W/Spect W/Wall Motion / EF    Medication Changes: No orders of the defined types were placed in this encounter.   Disposition:  Follow up prn  Signed Dezire Turk Verne Spurr, MD, 10/17/2022 8:57 AM    [facility] [address] [place], [place], [place] [address]

## 2022-10-25 ENCOUNTER — Encounter (HOSPITAL_COMMUNITY)
Admission: RE | Admit: 2022-10-25 | Discharge: 2022-10-25 | Disposition: A | Discharge status: Home / Self Care | Payer: Medicare PPO | Source: Ambulatory Visit | Attending: Internal Medicine | Admitting: Internal Medicine

## 2022-10-25 ENCOUNTER — Encounter (HOSPITAL_COMMUNITY): Payer: Self-pay

## 2022-10-25 ENCOUNTER — Encounter (HOSPITAL_COMMUNITY): Payer: Medicare PPO

## 2022-10-25 DIAGNOSIS — R079 Chest pain, unspecified: Secondary | ICD-10-CM | POA: Diagnosis not present

## 2022-10-25 LAB — NM MYOCAR MULTI W/SPECT W/WALL MOTION / EF
LV dias vol: 42 mL (ref 46–106)
LV sys vol: 8 mL
Nuc Stress EF: 80 %
Peak HR: 101 {beats}/min
RATE: 0.4
Rest HR: 82 {beats}/min
Rest Nuclear Isotope Dose: 10.6 mCi
SDS: 1
SRS: 4
SSS: 5
ST Depression (mm): 0 mm
Stress Nuclear Isotope Dose: 33 mCi
TID: 0.87

## 2022-10-25 MED ORDER — SODIUM CHLORIDE FLUSH 0.9 % IV SOLN
INTRAVENOUS | Status: AC
  Administered 2022-10-25: 10 mL
  Filled 2022-10-25: qty 10

## 2022-10-25 MED ORDER — REGADENOSON 0.4 MG/5ML IV SOLN
INTRAVENOUS | Status: AC
  Administered 2022-10-25: 0.4 mg
  Filled 2022-10-25: qty 5

## 2022-10-25 MED ORDER — TECHNETIUM TC 99M TETROFOSMIN IV KIT
30.0000 | PACK | Freq: Once | INTRAVENOUS | Status: AC | PRN
  Administered 2022-10-25: 33 via INTRAVENOUS

## 2022-10-25 MED ORDER — TECHNETIUM TC 99M TETROFOSMIN IV KIT
10.0000 | PACK | Freq: Once | INTRAVENOUS | Status: AC | PRN
  Administered 2022-10-25: 10.6 via INTRAVENOUS

## 2023-02-27 DIAGNOSIS — Z0001 Encounter for general adult medical examination with abnormal findings: Secondary | ICD-10-CM | POA: Diagnosis not present

## 2023-02-27 DIAGNOSIS — R0789 Other chest pain: Secondary | ICD-10-CM | POA: Diagnosis not present

## 2023-02-27 DIAGNOSIS — M8580 Other specified disorders of bone density and structure, unspecified site: Secondary | ICD-10-CM | POA: Diagnosis not present

## 2023-02-27 DIAGNOSIS — M40204 Unspecified kyphosis, thoracic region: Secondary | ICD-10-CM | POA: Diagnosis not present

## 2023-02-27 DIAGNOSIS — D519 Vitamin B12 deficiency anemia, unspecified: Secondary | ICD-10-CM | POA: Diagnosis not present

## 2023-02-27 DIAGNOSIS — M545 Low back pain, unspecified: Secondary | ICD-10-CM | POA: Diagnosis not present

## 2023-02-27 DIAGNOSIS — M546 Pain in thoracic spine: Secondary | ICD-10-CM | POA: Diagnosis not present

## 2023-02-27 DIAGNOSIS — E7849 Other hyperlipidemia: Secondary | ICD-10-CM | POA: Diagnosis not present

## 2023-02-27 DIAGNOSIS — M40209 Unspecified kyphosis, site unspecified: Secondary | ICD-10-CM | POA: Diagnosis not present

## 2023-04-02 DIAGNOSIS — M40209 Unspecified kyphosis, site unspecified: Secondary | ICD-10-CM | POA: Diagnosis not present

## 2023-04-02 DIAGNOSIS — Z6828 Body mass index (BMI) 28.0-28.9, adult: Secondary | ICD-10-CM | POA: Diagnosis not present

## 2023-04-02 DIAGNOSIS — M8580 Other specified disorders of bone density and structure, unspecified site: Secondary | ICD-10-CM | POA: Diagnosis not present

## 2023-04-02 DIAGNOSIS — M25512 Pain in left shoulder: Secondary | ICD-10-CM | POA: Diagnosis not present

## 2023-04-02 DIAGNOSIS — R030 Elevated blood-pressure reading, without diagnosis of hypertension: Secondary | ICD-10-CM | POA: Diagnosis not present

## 2023-08-13 DIAGNOSIS — H35033 Hypertensive retinopathy, bilateral: Secondary | ICD-10-CM | POA: Diagnosis not present

## 2023-08-13 DIAGNOSIS — H2513 Age-related nuclear cataract, bilateral: Secondary | ICD-10-CM | POA: Diagnosis not present

## 2023-09-10 DIAGNOSIS — H2511 Age-related nuclear cataract, right eye: Secondary | ICD-10-CM | POA: Diagnosis not present

## 2023-09-10 DIAGNOSIS — H2512 Age-related nuclear cataract, left eye: Secondary | ICD-10-CM | POA: Diagnosis not present

## 2023-09-10 DIAGNOSIS — H43822 Vitreomacular adhesion, left eye: Secondary | ICD-10-CM | POA: Diagnosis not present

## 2023-10-03 DIAGNOSIS — H25011 Cortical age-related cataract, right eye: Secondary | ICD-10-CM | POA: Diagnosis not present

## 2023-10-03 DIAGNOSIS — H2511 Age-related nuclear cataract, right eye: Secondary | ICD-10-CM | POA: Diagnosis not present

## 2023-10-11 DIAGNOSIS — D519 Vitamin B12 deficiency anemia, unspecified: Secondary | ICD-10-CM | POA: Diagnosis not present

## 2023-10-11 DIAGNOSIS — M546 Pain in thoracic spine: Secondary | ICD-10-CM | POA: Diagnosis not present

## 2023-10-11 DIAGNOSIS — M40209 Unspecified kyphosis, site unspecified: Secondary | ICD-10-CM | POA: Diagnosis not present

## 2023-10-11 DIAGNOSIS — R739 Hyperglycemia, unspecified: Secondary | ICD-10-CM | POA: Diagnosis not present

## 2023-10-11 DIAGNOSIS — M40204 Unspecified kyphosis, thoracic region: Secondary | ICD-10-CM | POA: Diagnosis not present

## 2023-10-11 DIAGNOSIS — R0789 Other chest pain: Secondary | ICD-10-CM | POA: Diagnosis not present

## 2023-10-11 DIAGNOSIS — M545 Low back pain, unspecified: Secondary | ICD-10-CM | POA: Diagnosis not present

## 2023-10-11 DIAGNOSIS — E782 Mixed hyperlipidemia: Secondary | ICD-10-CM | POA: Diagnosis not present

## 2023-10-11 DIAGNOSIS — M8580 Other specified disorders of bone density and structure, unspecified site: Secondary | ICD-10-CM | POA: Diagnosis not present

## 2023-10-11 DIAGNOSIS — E7849 Other hyperlipidemia: Secondary | ICD-10-CM | POA: Diagnosis not present

## 2023-10-12 DIAGNOSIS — H2512 Age-related nuclear cataract, left eye: Secondary | ICD-10-CM | POA: Diagnosis not present

## 2023-10-12 DIAGNOSIS — Z961 Presence of intraocular lens: Secondary | ICD-10-CM | POA: Diagnosis not present

## 2023-11-01 DIAGNOSIS — H25012 Cortical age-related cataract, left eye: Secondary | ICD-10-CM | POA: Diagnosis not present

## 2023-11-01 DIAGNOSIS — H2512 Age-related nuclear cataract, left eye: Secondary | ICD-10-CM | POA: Diagnosis not present

## 2024-04-10 DIAGNOSIS — R739 Hyperglycemia, unspecified: Secondary | ICD-10-CM | POA: Diagnosis not present

## 2024-04-10 DIAGNOSIS — M40204 Unspecified kyphosis, thoracic region: Secondary | ICD-10-CM | POA: Diagnosis not present

## 2024-04-10 DIAGNOSIS — D649 Anemia, unspecified: Secondary | ICD-10-CM | POA: Diagnosis not present

## 2024-04-10 DIAGNOSIS — D519 Vitamin B12 deficiency anemia, unspecified: Secondary | ICD-10-CM | POA: Diagnosis not present

## 2024-04-10 DIAGNOSIS — Z1329 Encounter for screening for other suspected endocrine disorder: Secondary | ICD-10-CM | POA: Diagnosis not present

## 2024-04-10 DIAGNOSIS — E782 Mixed hyperlipidemia: Secondary | ICD-10-CM | POA: Diagnosis not present

## 2024-04-10 DIAGNOSIS — Z6828 Body mass index (BMI) 28.0-28.9, adult: Secondary | ICD-10-CM | POA: Diagnosis not present

## 2024-04-11 DIAGNOSIS — Z0001 Encounter for general adult medical examination with abnormal findings: Secondary | ICD-10-CM | POA: Diagnosis not present

## 2024-04-11 DIAGNOSIS — Z6828 Body mass index (BMI) 28.0-28.9, adult: Secondary | ICD-10-CM | POA: Diagnosis not present

## 2024-04-11 DIAGNOSIS — Z1331 Encounter for screening for depression: Secondary | ICD-10-CM | POA: Diagnosis not present

## 2024-04-11 DIAGNOSIS — Z1389 Encounter for screening for other disorder: Secondary | ICD-10-CM | POA: Diagnosis not present

## 2024-04-16 DIAGNOSIS — L98499 Non-pressure chronic ulcer of skin of other sites with unspecified severity: Secondary | ICD-10-CM | POA: Diagnosis not present

## 2024-04-16 DIAGNOSIS — D0462 Carcinoma in situ of skin of left upper limb, including shoulder: Secondary | ICD-10-CM | POA: Diagnosis not present

## 2024-04-16 DIAGNOSIS — D485 Neoplasm of uncertain behavior of skin: Secondary | ICD-10-CM | POA: Diagnosis not present

## 2024-04-16 DIAGNOSIS — L814 Other melanin hyperpigmentation: Secondary | ICD-10-CM | POA: Diagnosis not present

## 2024-04-16 DIAGNOSIS — L573 Poikiloderma of Civatte: Secondary | ICD-10-CM | POA: Diagnosis not present

## 2024-04-16 DIAGNOSIS — L7211 Pilar cyst: Secondary | ICD-10-CM | POA: Diagnosis not present

## 2024-04-16 DIAGNOSIS — C44329 Squamous cell carcinoma of skin of other parts of face: Secondary | ICD-10-CM | POA: Diagnosis not present

## 2024-04-16 DIAGNOSIS — L570 Actinic keratosis: Secondary | ICD-10-CM | POA: Diagnosis not present

## 2024-05-08 DIAGNOSIS — C44629 Squamous cell carcinoma of skin of left upper limb, including shoulder: Secondary | ICD-10-CM | POA: Diagnosis not present

## 2024-05-08 DIAGNOSIS — C44329 Squamous cell carcinoma of skin of other parts of face: Secondary | ICD-10-CM | POA: Diagnosis not present

## 2024-05-14 DIAGNOSIS — M6281 Muscle weakness (generalized): Secondary | ICD-10-CM | POA: Diagnosis not present

## 2024-05-16 DIAGNOSIS — M6281 Muscle weakness (generalized): Secondary | ICD-10-CM | POA: Diagnosis not present

## 2024-05-19 DIAGNOSIS — M6281 Muscle weakness (generalized): Secondary | ICD-10-CM | POA: Diagnosis not present

## 2024-05-26 DIAGNOSIS — M6281 Muscle weakness (generalized): Secondary | ICD-10-CM | POA: Diagnosis not present

## 2024-05-28 DIAGNOSIS — M6281 Muscle weakness (generalized): Secondary | ICD-10-CM | POA: Diagnosis not present

## 2024-06-02 DIAGNOSIS — M6281 Muscle weakness (generalized): Secondary | ICD-10-CM | POA: Diagnosis not present

## 2024-06-04 DIAGNOSIS — M6281 Muscle weakness (generalized): Secondary | ICD-10-CM | POA: Diagnosis not present

## 2024-06-09 DIAGNOSIS — M6281 Muscle weakness (generalized): Secondary | ICD-10-CM | POA: Diagnosis not present

## 2024-06-11 DIAGNOSIS — M6281 Muscle weakness (generalized): Secondary | ICD-10-CM | POA: Diagnosis not present

## 2024-08-12 DIAGNOSIS — E119 Type 2 diabetes mellitus without complications: Secondary | ICD-10-CM | POA: Diagnosis not present

## 2024-08-12 DIAGNOSIS — M40204 Unspecified kyphosis, thoracic region: Secondary | ICD-10-CM | POA: Diagnosis not present

## 2024-08-12 DIAGNOSIS — R739 Hyperglycemia, unspecified: Secondary | ICD-10-CM | POA: Diagnosis not present

## 2024-08-12 DIAGNOSIS — Z6828 Body mass index (BMI) 28.0-28.9, adult: Secondary | ICD-10-CM | POA: Diagnosis not present

## 2024-08-12 DIAGNOSIS — M810 Age-related osteoporosis without current pathological fracture: Secondary | ICD-10-CM | POA: Diagnosis not present

## 2024-08-12 DIAGNOSIS — M25512 Pain in left shoulder: Secondary | ICD-10-CM | POA: Diagnosis not present

## 2024-08-22 DIAGNOSIS — M25512 Pain in left shoulder: Secondary | ICD-10-CM | POA: Diagnosis not present
